# Patient Record
Sex: Female | Born: 1975 | Race: White | Hispanic: Yes | Marital: Married | Smoking: Never smoker
Health system: Southern US, Community
[De-identification: ages and names within clinical notes are randomized; demographics above are authoritative.]

## PROBLEM LIST (undated history)

## (undated) DIAGNOSIS — Z87898 Personal history of other specified conditions: Secondary | ICD-10-CM

## (undated) DIAGNOSIS — T7840XA Allergy, unspecified, initial encounter: Secondary | ICD-10-CM

## (undated) DIAGNOSIS — K219 Gastro-esophageal reflux disease without esophagitis: Secondary | ICD-10-CM

## (undated) HISTORY — DX: Personal history of other specified conditions: Z87.898

## (undated) HISTORY — DX: Allergy, unspecified, initial encounter: T78.40XA

## (undated) HISTORY — DX: Gastro-esophageal reflux disease without esophagitis: K21.9

## (undated) HISTORY — PX: NASAL SINUS SURGERY: SHX719

## (undated) HISTORY — PX: APPENDECTOMY: SHX54

---

## 1998-07-28 ENCOUNTER — Other Ambulatory Visit: Admission: RE | Admit: 1998-07-28 | Discharge: 1998-07-28 | Payer: Self-pay | Admitting: Gynecology

## 1998-09-09 ENCOUNTER — Encounter: Admission: RE | Admit: 1998-09-09 | Discharge: 1998-12-08 | Payer: Self-pay | Admitting: Gynecology

## 1998-11-03 ENCOUNTER — Inpatient Hospital Stay (HOSPITAL_COMMUNITY): Admission: AD | Admit: 1998-11-03 | Discharge: 1998-11-03 | Payer: Self-pay | Admitting: Gynecology

## 1998-11-27 ENCOUNTER — Inpatient Hospital Stay (HOSPITAL_COMMUNITY): Admission: AD | Admit: 1998-11-27 | Discharge: 1998-11-27 | Payer: Self-pay | Admitting: Gynecology

## 1999-01-13 ENCOUNTER — Encounter: Payer: Self-pay | Admitting: Gynecology

## 1999-01-13 ENCOUNTER — Ambulatory Visit (HOSPITAL_COMMUNITY): Admission: RE | Admit: 1999-01-13 | Discharge: 1999-01-13 | Payer: Self-pay | Admitting: Gynecology

## 1999-01-18 ENCOUNTER — Encounter: Admission: RE | Admit: 1999-01-18 | Discharge: 1999-04-18 | Payer: Self-pay | Admitting: Gynecology

## 1999-03-12 ENCOUNTER — Inpatient Hospital Stay (HOSPITAL_COMMUNITY): Admission: AD | Admit: 1999-03-12 | Discharge: 1999-03-12 | Payer: Self-pay | Admitting: Gynecology

## 1999-03-13 ENCOUNTER — Observation Stay (HOSPITAL_COMMUNITY): Admission: AD | Admit: 1999-03-13 | Discharge: 1999-03-14 | Payer: Self-pay | Admitting: Gynecology

## 1999-03-15 ENCOUNTER — Inpatient Hospital Stay (HOSPITAL_COMMUNITY): Admission: AD | Admit: 1999-03-15 | Discharge: 1999-03-19 | Payer: Self-pay | Admitting: Gynecology

## 1999-03-17 ENCOUNTER — Encounter (INDEPENDENT_AMBULATORY_CARE_PROVIDER_SITE_OTHER): Payer: Self-pay | Admitting: Specialist

## 1999-03-20 ENCOUNTER — Encounter (HOSPITAL_COMMUNITY): Admission: RE | Admit: 1999-03-20 | Discharge: 1999-06-18 | Payer: Self-pay | Admitting: Gynecology

## 2000-11-24 ENCOUNTER — Inpatient Hospital Stay (HOSPITAL_COMMUNITY): Admission: AD | Admit: 2000-11-24 | Discharge: 2000-11-24 | Payer: Self-pay | Admitting: Obstetrics & Gynecology

## 2001-01-05 ENCOUNTER — Ambulatory Visit (HOSPITAL_COMMUNITY): Admission: RE | Admit: 2001-01-05 | Discharge: 2001-01-05 | Payer: Self-pay | Admitting: *Deleted

## 2001-02-20 ENCOUNTER — Inpatient Hospital Stay (HOSPITAL_COMMUNITY): Admission: AD | Admit: 2001-02-20 | Discharge: 2001-02-20 | Payer: Self-pay | Admitting: *Deleted

## 2001-02-28 ENCOUNTER — Inpatient Hospital Stay (HOSPITAL_COMMUNITY): Admission: AD | Admit: 2001-02-28 | Discharge: 2001-03-03 | Payer: Self-pay | Admitting: *Deleted

## 2001-07-03 ENCOUNTER — Other Ambulatory Visit: Admission: RE | Admit: 2001-07-03 | Discharge: 2001-07-03 | Payer: Self-pay | Admitting: Obstetrics & Gynecology

## 2003-07-24 ENCOUNTER — Other Ambulatory Visit: Admission: RE | Admit: 2003-07-24 | Discharge: 2003-07-24 | Payer: Self-pay | Admitting: Gynecology

## 2004-05-31 ENCOUNTER — Encounter: Admission: RE | Admit: 2004-05-31 | Discharge: 2004-05-31 | Payer: Self-pay | Admitting: Otolaryngology

## 2004-08-18 ENCOUNTER — Other Ambulatory Visit: Admission: RE | Admit: 2004-08-18 | Discharge: 2004-08-18 | Payer: Self-pay | Admitting: Gynecology

## 2005-10-13 ENCOUNTER — Other Ambulatory Visit: Admission: RE | Admit: 2005-10-13 | Discharge: 2005-10-13 | Payer: Self-pay | Admitting: Gynecology

## 2006-10-25 ENCOUNTER — Other Ambulatory Visit: Admission: RE | Admit: 2006-10-25 | Discharge: 2006-10-25 | Payer: Self-pay | Admitting: Gynecology

## 2006-11-18 DIAGNOSIS — Z9889 Other specified postprocedural states: Secondary | ICD-10-CM

## 2006-12-06 ENCOUNTER — Ambulatory Visit (HOSPITAL_BASED_OUTPATIENT_CLINIC_OR_DEPARTMENT_OTHER): Admission: RE | Admit: 2006-12-06 | Discharge: 2006-12-06 | Payer: Self-pay | Admitting: Gynecology

## 2007-02-14 ENCOUNTER — Encounter: Payer: Self-pay | Admitting: Internal Medicine

## 2007-02-14 ENCOUNTER — Ambulatory Visit: Payer: Self-pay | Admitting: Internal Medicine

## 2007-02-14 DIAGNOSIS — IMO0002 Reserved for concepts with insufficient information to code with codable children: Secondary | ICD-10-CM | POA: Insufficient documentation

## 2007-04-16 ENCOUNTER — Ambulatory Visit: Payer: Self-pay | Admitting: Family Medicine

## 2007-04-16 DIAGNOSIS — R42 Dizziness and giddiness: Secondary | ICD-10-CM

## 2007-04-16 LAB — CONVERTED CEMR LAB: Beta hcg, urine, semiquantitative: NEGATIVE

## 2007-04-20 ENCOUNTER — Encounter (INDEPENDENT_AMBULATORY_CARE_PROVIDER_SITE_OTHER): Payer: Self-pay | Admitting: *Deleted

## 2007-06-04 ENCOUNTER — Ambulatory Visit: Payer: Self-pay | Admitting: Family Medicine

## 2007-06-04 DIAGNOSIS — L259 Unspecified contact dermatitis, unspecified cause: Secondary | ICD-10-CM

## 2007-06-05 ENCOUNTER — Telehealth (INDEPENDENT_AMBULATORY_CARE_PROVIDER_SITE_OTHER): Payer: Self-pay | Admitting: Family Medicine

## 2007-06-05 LAB — CONVERTED CEMR LAB
Basophils Relative: 0.2 % (ref 0.0–1.0)
Eosinophils Absolute: 0 10*3/uL (ref 0.0–0.6)
Eosinophils Relative: 0.4 % (ref 0.0–5.0)
HCT: 33.4 % — ABNORMAL LOW (ref 36.0–46.0)
LDL Cholesterol: 106 mg/dL — ABNORMAL HIGH (ref 0–99)
Neutrophils Relative %: 59.4 % (ref 43.0–77.0)
Platelets: 212 10*3/uL (ref 150–400)
RBC: 3.8 M/uL — ABNORMAL LOW (ref 3.87–5.11)
RDW: 13.9 % (ref 11.5–14.6)
TSH: 1.53 microintl units/mL (ref 0.35–5.50)
Total CHOL/HDL Ratio: 3.3
Triglycerides: 40 mg/dL (ref 0–149)
VLDL: 8 mg/dL (ref 0–40)
WBC: 4.4 10*3/uL — ABNORMAL LOW (ref 4.5–10.5)

## 2007-06-12 ENCOUNTER — Ambulatory Visit: Payer: Self-pay | Admitting: Family Medicine

## 2007-06-12 LAB — CONVERTED CEMR LAB
Ferritin: 5.7 ng/mL — ABNORMAL LOW (ref 10.0–291.0)
Iron: 63 ug/dL (ref 42–145)
Transferrin: 291.8 mg/dL (ref >=212.0)

## 2007-06-13 ENCOUNTER — Encounter (INDEPENDENT_AMBULATORY_CARE_PROVIDER_SITE_OTHER): Payer: Self-pay | Admitting: Family Medicine

## 2007-06-13 DIAGNOSIS — D649 Anemia, unspecified: Secondary | ICD-10-CM | POA: Insufficient documentation

## 2007-06-14 ENCOUNTER — Telehealth (INDEPENDENT_AMBULATORY_CARE_PROVIDER_SITE_OTHER): Payer: Self-pay | Admitting: *Deleted

## 2008-01-25 ENCOUNTER — Ambulatory Visit (HOSPITAL_COMMUNITY): Admission: RE | Admit: 2008-01-25 | Discharge: 2008-01-25 | Payer: Self-pay | Admitting: Obstetrics and Gynecology

## 2008-06-26 ENCOUNTER — Telehealth (INDEPENDENT_AMBULATORY_CARE_PROVIDER_SITE_OTHER): Payer: Self-pay | Admitting: *Deleted

## 2009-04-13 LAB — CONVERTED CEMR LAB: Pap Smear: NORMAL

## 2009-07-23 ENCOUNTER — Emergency Department (HOSPITAL_COMMUNITY): Admission: EM | Admit: 2009-07-23 | Discharge: 2009-07-23 | Payer: Self-pay | Admitting: Emergency Medicine

## 2009-09-23 ENCOUNTER — Inpatient Hospital Stay: Admission: AD | Admit: 2009-09-23 | Discharge: 2009-09-24 | Payer: Self-pay | Admitting: Obstetrics and Gynecology

## 2009-09-24 ENCOUNTER — Inpatient Hospital Stay (HOSPITAL_COMMUNITY): Admission: AD | Admit: 2009-09-24 | Discharge: 2009-09-27 | Payer: Self-pay | Admitting: Obstetrics and Gynecology

## 2009-12-16 ENCOUNTER — Ambulatory Visit: Payer: Self-pay | Admitting: Family

## 2009-12-16 ENCOUNTER — Telehealth: Payer: Self-pay | Admitting: Family

## 2009-12-16 DIAGNOSIS — H60399 Other infective otitis externa, unspecified ear: Secondary | ICD-10-CM | POA: Insufficient documentation

## 2009-12-16 DIAGNOSIS — G5600 Carpal tunnel syndrome, unspecified upper limb: Secondary | ICD-10-CM | POA: Insufficient documentation

## 2010-02-01 ENCOUNTER — Ambulatory Visit: Payer: Self-pay | Admitting: Internal Medicine

## 2010-02-01 DIAGNOSIS — N912 Amenorrhea, unspecified: Secondary | ICD-10-CM | POA: Insufficient documentation

## 2010-02-01 DIAGNOSIS — R1084 Generalized abdominal pain: Secondary | ICD-10-CM | POA: Insufficient documentation

## 2010-02-01 DIAGNOSIS — R197 Diarrhea, unspecified: Secondary | ICD-10-CM

## 2010-02-01 LAB — CONVERTED CEMR LAB
AST: 17 units/L (ref 0–37)
BUN: 12 mg/dL (ref 6–23)
Basophils Absolute: 0 10*3/uL (ref 0.0–0.1)
Bilirubin, Direct: 0.1 mg/dL (ref 0.0–0.3)
Creatinine, Ser: 0.62 mg/dL (ref 0.40–1.20)
Eosinophils Relative: 1 % (ref 0–5)
FSH: 5.6 milliintl units/mL
HCT: 39.2 % (ref 36.0–46.0)
Indirect Bilirubin: 0.3 mg/dL (ref 0.0–0.9)
LH: 5.4 milliintl units/mL
Lipase: 30 units/L (ref 0–75)
Lymphocytes Relative: 34 % (ref 12–46)
Neutrophils Relative %: 58 % (ref 43–77)
Platelets: 266 10*3/uL (ref 150–400)
Potassium: 4.6 meq/L (ref 3.5–5.3)
RDW: 15 % (ref 11.5–15.5)
TSH: 2.005 microintl units/mL (ref 0.350–4.500)
Total Bilirubin: 0.4 mg/dL (ref 0.3–1.2)

## 2010-02-02 ENCOUNTER — Telehealth: Payer: Self-pay | Admitting: Internal Medicine

## 2010-02-08 ENCOUNTER — Ambulatory Visit: Payer: Self-pay | Admitting: Internal Medicine

## 2010-05-10 ENCOUNTER — Ambulatory Visit: Payer: Self-pay | Admitting: Internal Medicine

## 2010-05-10 DIAGNOSIS — H6980 Other specified disorders of Eustachian tube, unspecified ear: Secondary | ICD-10-CM | POA: Insufficient documentation

## 2010-05-10 DIAGNOSIS — K589 Irritable bowel syndrome without diarrhea: Secondary | ICD-10-CM | POA: Insufficient documentation

## 2010-05-10 LAB — CONVERTED CEMR LAB
Blood in Urine, dipstick: NEGATIVE
Nitrite: NEGATIVE
Protein, U semiquant: NEGATIVE
WBC Urine, dipstick: NEGATIVE

## 2010-10-19 NOTE — Assessment & Plan Note (Signed)
Summary: cpx/mhf   Vital Signs:  Patient profile:   35 year old female Height:      62.25 inches Weight:      145.50 pounds BMI:     26.49 O2 Sat:      99 % on Room air Temp:     98.0 degrees F oral Pulse rate:   69 / minute Pulse rhythm:   regular Resp:     14 per minute BP sitting:   100 / 70  (left arm) Cuff size:   regular  Vitals Entered By: Glendell Docker CMA (May 10, 2010 11:27 AM)  O2 Flow:  Room air CC: CPX Is Patient Diabetic? No Pain Assessment Patient in pain? no       Does patient need assistance? Functional Status Self care Ambulation Normal Comments no concerns, not breast feeding   Primary Care Provider:  Dondra Spry DO  CC:  CPX.  History of Present Illness: 35 y/o Hispanic female for routine cpx overall doing well  1 week lower abd cramping her periods are regular - 28 - 30 days no blood in stools no change in bowel habits her husband had vasectomy  when she has symptoms - last 1 day no improvement with midrol not related to menstrual cycle  still having intermittent vertigo both ears -  pain and echo,  occ click in the ear  Preventive Screening-Counseling & Management  Alcohol-Tobacco     Alcohol drinks/day: <1     Smoking Status: never  Caffeine-Diet-Exercise     Caffeine use/day: 1-2 beverages daily     Does Patient Exercise: no  Allergies (verified): No Known Drug Allergies  Past History:  Past Medical History: History of intermittent vertigo G2 P2      Past Surgical History: Caesarean section X 2 Sinus surgery Appendectomy      Family History: CAD - M. Aunt DM - MGF Breast cancer  Social History: Married Three children. Speaks Spanish  Never Smoked  Alcohol use-yes Regular exercise-no Caffeine use/day:  1-2 beverages daily  Review of Systems       The patient complains of abdominal pain.  The patient denies fever, weight loss, chest pain, dyspnea on exertion, prolonged cough, melena,  hematochezia, severe indigestion/heartburn, and depression.         had a bad migraine several weeks ago last migraine was 6 yrs ago  Physical Exam  General:  alert, well-developed, and well-nourished.   Head:  normocephalic and atraumatic.   Eyes:  pupils equal, pupils round, and pupils reactive to light.   Ears:  Rt and Lt TM slightly retracted 1-2 mm papilloma right ext aud canal Mouth:  good dentition and pharynx pink and moist.   Neck:  supple, no masses, no thyromegaly, and no carotid bruits.   Lungs:  normal respiratory effort, normal breath sounds, no crackles, and no wheezes.   Heart:  normal rate, regular rhythm, no murmur, and no gallop.   Abdomen:  soft.  mild LUQ and suprapubic tenderness Extremities:  No lower extremity edema  Neurologic:  cranial nerves II-XII intact and gait normal.     Impression & Recommendations:  Problem # 1:  WELL ADULT EXAM (ICD-V70.0) Reviewed adult health maintenance protocols. routine PAP / Pelvic with GYN we discussed obtaining lipid panel next year Pap smear: normal (04/13/2009) Td Booster: Td (09/19/2001)   Chol: 164 (06/04/2007)   HDL: 50.1 (06/04/2007)   LDL: 106 (06/04/2007)   TG: 40 (06/04/2007) TSH: 2.005 (02/01/2010)  Problem # 2:  IRRITABLE BOWEL SYNDROME (ICD-564.1) she has intermittent abd bloating / cramping sensation c/w IBS. she reports  BMs q 2 days - usually somewhat hard to pass I rec daily fiber supplement and probiotic use Patient advised to call office if symptoms persist or worsen.  Problem # 3:  EUSTACHIAN TUBE DYSFUNCTION (ICD-381.81) intermittent clicking sensation use allegra and intranasal steroid spray  Complete Medication List: 1)  Omeprazole 40 Mg Cpdr (Omeprazole) .... One by mouth once daily 30 mins before am meal 2)  Fexofenadine Hcl 180 Mg Tabs (Fexofenadine hcl) .... One by mouth once daily 3)  Fluticasone Propionate 50 Mcg/act Susp (Fluticasone propionate) .... 2 sprays each nostril once  daily  Other Orders: UA Dipstick w/o Micro (manual) (10272) Urine Pregnancy Test  (53664)  Patient Instructions: 1)  Take fiber supplement (metamucile once daily) 2)  Use probiotic supplement daily (Align, Accu Flora) 3)  Please schedule a follow-up appointment in 2 months. Prescriptions: FLUTICASONE PROPIONATE 50 MCG/ACT SUSP (FLUTICASONE PROPIONATE) 2 sprays each nostril once daily  #1 x 2   Entered and Authorized by:   D. Thomos Lemons DO   Signed by:   D. Thomos Lemons DO on 05/10/2010   Method used:   Electronically to        [facility] [address] (retail)       [address]       [place], [place]  [address]       Ph: [phone] or [phone]       Fax: [phone]   RxID:   [id number] FEXOFENADINE HCL 180 MG TABS (FEXOFENADINE HCL) one by mouth once daily  #90 x 1   Entered and Authorized by:   D. Thomos Lemons DO   Signed by:   D. Thomos Lemons DO on 05/10/2010   Method used:   Electronically to        [facility] [address] (retail)       [address]       [place], [place]  [address]       Ph: [phone] or [phone]       Fax: [phone]   RxID:   [id number]   Current Allergies (reviewed today): No known allergies     Laboratory Results   Urine Tests    Routine Urinalysis   Color: yellow Appearance: Clear Glucose: negative   (Normal Range: Negative) Bilirubin: negative   (Normal Range: Negative) Ketone: negative   (Normal Range: Negative) Spec. Gravity: 1.010   (Normal Range: 1.003-1.035) Blood: negative   (Normal Range: Negative) pH: 7.0   (Normal Range: 5.0-8.0) Protein: negative   (Normal Range: Negative) Urobilinogen: 0.2   (Normal Range: 0-1) Nitrite: negative   (Normal Range: Negative) Leukocyte Esterace: negative   (Normal Range: Negative)    Urine HCG: negative

## 2010-10-19 NOTE — Progress Notes (Signed)
Summary: lab results  Phone Note Outgoing Call   Summary of Call: call pt - blood work is normal.  I will discuss in detail at next OV Initial call taken by: D. Thomos Lemons DO,  Feb 02, 2010 9:43 AM  Follow-up for Phone Call        Pt advised per Dr. Olegario Messier instructions.  Mervin Kung CMA  Feb 02, 2010 9:53 AM

## 2010-10-19 NOTE — Assessment & Plan Note (Signed)
Summary: SICK ON STOMACH & HEADACHE/DT   Vital Signs:  Patient profile:   35 year old female Weight:      144.25 pounds BMI:     26.27 O2 Sat:      98 % on Room air Temp:     98.2 degrees F oral Pulse rate:   72 / minute Pulse rhythm:   regular Resp:     20 per minute BP sitting:   94 / 60  (right arm) Cuff size:   regular  Vitals Entered By: Glendell Docker CMA (Feb 01, 2010 1:28 PM)  O2 Flow:  Room air CC: Rm - 2 Stomach discomfort & Nausea   Primary Care Provider:  Dondra Spry DO  CC:  Rm - 2 Stomach discomfort & Nausea.  History of Present Illness: 35 y/o [place] female with multiple complaints.  dizziness, headache, abd pain and nausea prolonged standing causes dizziness also when she gets out bed too quickly  headaches (top of head)  severity 4 out of 5 takes ibuprofen for headache,  sometimes helps - not all the time take 400-600 mg sometimes 3 times per day  (3-4 x per week) tried zantac but no improvement  nausea and abd pain - 4/23 went to [place] to visit family sister and pt both got GI illness she was vomiting and experienced diarrhea.  vomiting better but still having abd pain and loose stools now after eating - she sharp left upper abd pain Loose BM's twice per  sister noted to have thyphoid   Allergies (verified): No Known Drug Allergies  Past History:  Past Medical History: History of vertigo G2 P2    Past Surgical History: Caesarean section X 2 Sinus surgery Appendectomy    Family History: CAD - M. Aunt DM - MGF     Social History: Married  two children. Speaks Spanish  Never Smoked Alcohol use-yes Regular exercise-no  Review of Systems       no menstruation since childbirth 4 months ago.  some blood loss during c section but she did not require transfusion   Physical Exam  General:  alert, well-developed, and well-nourished.   Head:  normocephalic and atraumatic.   Eyes:  pupils equal, pupils round, and pupils  reactive to light.  no nystagmus Ears:  R ear normal and L ear normal.   Mouth:  Oral mucosa and oropharynx without lesions or exudates.  Teeth in good repair. Neck:  No deformities, masses, or tenderness noted. Lungs:  normal respiratory effort, normal breath sounds, no crackles, and no wheezes.   Heart:  normal rate, regular rhythm, no murmur, and no gallop.   Abdomen:  epigastric and LUQ tenderness.soft, no guarding, no rigidity, no rebound tenderness, no hepatomegaly, and no splenomegaly.   Skin:  color normal, no rashes, and no suspicious lesions.   Psych:  normally interactive, good eye contact, not anxious appearing, and not depressed appearing.     Impression & Recommendations:  Problem # 1:  ABDOMINAL PAIN, GENERALIZED (ICD-789.07) 35 y/o [place] female with persistent abd pain after acute nausea , vomiting and diarrhea.    she traveled to [place] 4/23.  sister suspected to have typhoid.  check stool culture.    NSAIDs may be aggravating abd pain.  DC nsaids.  use PPI.  Orders: T-Basic Metabolic Panel [id number]) T-Hepatic Function [id number]) T-CBC w/Diff [id number]) T-Lipase [id number])  Problem # 2:  AMENORRHEA (ICD-626.0) Likely related to breast feeding.  urine pregnancy is normal.  check labs.  if prolactin level elevated.  consider MRI of brain consider headaches  Orders: T-TSH [id number]) T-T4, Free [id number]) T-LH [id number]) T-FSH [id number]) T- * Misc. Laboratory test [id number])  Problem # 3:  DIARRHEA, ACUTE (ICD-787.91) she has mild orthostasis.  BP laying 98/70, pulse 66.  BP standing 90/70,  pulse 89.  aggressive oral hydration.  I suspect dehydration contributing to headache. Orders: T-Culture, Stool ([id number]) T-Culture, Giardia / Cryptosporidium ([id number])  Complete Medication List: 1)  Omeprazole 40 Mg Cpdr (Omeprazole) .... One by mouth once daily 30 mins before am meal 2)  Ciprofloxacin Hcl 500 Mg Tabs (Ciprofloxacin  hcl) .... One by mouth bid  Patient Instructions: 1)  Stop taking ibuprofen 2)  Use tylenol 650 mg two times a day as needed for headache 3)  Please schedule a follow-up appointment in 1 week. 4)  Increase fluids  (8 to 10 glasses of fluids per day) Prescriptions: CIPROFLOXACIN HCL 500 MG TABS (CIPROFLOXACIN HCL) one by mouth bid  #14 x 0   Entered and Authorized by:   D. Thomos Lemons DO   Signed by:   D. Thomos Lemons DO on 02/01/2010   Method used:   Electronically to        [facility] [address] (retail)       [address]       [place], [place]  [address]       Ph: [phone] or [phone]       Fax: [phone]   RxID:   [id number] OMEPRAZOLE 40 MG CPDR (OMEPRAZOLE) one by mouth once daily 30 mins before AM meal  #30 x 3   Entered and Authorized by:   D. Thomos Lemons DO   Signed by:   D. Thomos Lemons DO on 02/01/2010   Method used:   Electronically to        [facility] [address] (retail)       [address]       [place], [place]  [address]       Ph: [phone] or [phone]       Fax: [phone]   RxID:   [id number]   Current Allergies (reviewed today): No known allergies     Preventive Care Screening  Pap Smear:    Date:  04/13/2009    Results:  normal

## 2010-10-19 NOTE — Assessment & Plan Note (Signed)
Summary: 1 WEEK FU/DT   Vital Signs:  Patient profile:   35 year old female Temp:     97.9 degrees F oral Pulse rate:   77 / minute Pulse rhythm:   regular Resp:     16 per minute BP sitting:   111 / 67  (left arm) Cuff size:   regular  Vitals Entered By: Glendell Docker CMA (Feb 08, 2010 11:22 AM) CC: Rm 2 - 1 Week Follow up Comments no concerns, feels much better.   Primary Care Provider:  DThomos Lemons DO  CC:  Rm 2 - 1 Week Follow up.  History of Present Illness: 35 y/o [place] female recently seen for diarrhea, nausea, headache and dizziness for f/u her symptoms completely resolved after cipro and oral rehydration she is feeling back to herself stools are formed and normal headaches resolved  ammenorrhea - labs reviewed.  urine preg was negative   Allergies (verified): No Known Drug Allergies  Past History:  Past Medical History: History of vertigo G2 P2     Past Surgical History: Caesarean section X 2 Sinus surgery Appendectomy     Family History: CAD - M. Aunt DM - MGF      Social History: Married  two children. Speaks Spanish  Never Smoked Alcohol use-yes Regular exercise-no  Physical Exam  General:  alert, well-developed, and well-nourished.   Lungs:  normal respiratory effort and normal breath sounds.   Heart:  normal rate, regular rhythm, and no gallop.   Msk:   Extremities:  No lower extremity edema Psych:  normally interactive, good eye contact, not anxious appearing, and not depressed appearing.     Impression & Recommendations:  Problem # 1:  DIARRHEA, ACUTE (ICD-787.91) Assessment Improved GI illness resolved after completing cipro.  she never completed stool cultures.   I suspect infectious cause.  Problem # 2:  AMENORRHEA (ICD-626.0) Urine preg negative.   TSH, prolactin, FSH, LH - normal.  I suspect amenorrhea from breast feeding. she plans to breast feed for addt'l 2 months.  Complete Medication List: 1)  Omeprazole  40 Mg Cpdr (Omeprazole) .... One by mouth once daily 30 mins before am meal  Patient Instructions: 1)  Please schedule a follow-up appointment in 3 months for CPX 2)  Lipid Panel prior to visit, ICD-9: v70 3)  Please return for lab work one (1) week before your next appointment.   Current Allergies (reviewed today): No known allergies

## 2010-10-19 NOTE — Progress Notes (Signed)
Summary: cipro drops  Phone Note Call from Patient   Summary of Call: Pt. states the Cipro drops are too expensive.  She would like [place] to send in Neomycin/Polymixin B drops. Pls. advise. Call pt. back on cell# when completed.  Mervin Kung CMA  December 16, 2009 3:32 PM   Follow-up for Phone Call        OK to call in corticosporin otic 4 drops each ear three times a day x 7 days Follow-up by: Lemont Fillers FNP,  December 16, 2009 4:14 PM  Additional Follow-up for Phone Call Additional follow up Details #1::        Rx. called to Dorma Russell at [facility]. Pt. notified.  Nicki Guadalajara Fergerson CMA  December 16, 2009 4:53 PM     New/Updated Medications: * CORTICOSPORIN OTIC DROPS. Apply 4 drops each ear three times a day.  Current Allergies: No known allergies

## 2010-10-19 NOTE — Assessment & Plan Note (Signed)
Summary: ARM PAIN  RIGHT  /HEA   Vital Signs:  Patient profile:   35 year old female Height:      62.25 inches Weight:      148.01 pounds BMI:     26.95 Temp:     97.9 degrees F oral Pulse rate:   79 / minute Pulse rhythm:   regular Resp:     16 per minute BP sitting:   108 / 70  (left arm) Cuff size:   regular  Vitals Entered By: Jeanette Jackson CMA (December 16, 2009 2:15 PM) CC: ROOM 4  Right wrist pain and swelling x 5 months. Pain radiates to hand and has some stiffness in fingers in the mornings.   CC:  ROOM 4  Right wrist pain and swelling x 5 months. Pain radiates to hand and has some stiffness in fingers in the mornings..  History of Present Illness: Jeanette Jackson is a 35 year old female who presents with c/o 5 month history of right wrist pain and hand numbness.  Notes + stiffness in her fingers.  Notes that she has occasional swelling on the dorsal aspect of the right wrist.  Pain is made worse by movement of her right thumb.  Patient notes that these symptoms started during her pregnancy. She has a 32 month old son.  She has tried ibuprofen with minimal improvement.  Notes that she has difficultly opening jars or door handles.  Notes pain improves as the day moves on. Patient tells me that she is currently breastfeeding. She has had some numbness in the fingers of her left hand right after delivery- this has now resolved.  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   Allergies (verified): No Known Drug Allergies  Physical Exam  General:  Well-developed,well-nourished,in no acute distress; alert,appropriate and cooperative throughout examination Head:  Normocephalic and atraumatic without obvious abnormalities. No apparent alopecia or balding. Ears:  +mild otitis exerna, dry skin with minimal erythema noted in bilateral ear canals.  R greater than L Msk:  Full ROM right wrist.  + phalan's, neg Tinels.  No swelling of wrist.   Impression &  Recommendations:  Problem # 1:  CARPAL TUNNEL SYNDROME (ICD-354.0) I recommended that the patient wear brace during the day as well as the night.  I recommended regular motrin x 1 week.  If no improvements with these measures, will consider referral to orthopedics.  Problem # 2:  OTITIS EXTERNA (ICD-380.10)  Her updated medication list for this problem includes:    Cipro Hc 0.2-1 % Susp (Ciprofloxacin-hydrocortisone) .Marland KitchenMarland KitchenMarland KitchenMarland Kitchen 3 drops in each ear twice daily for 7 days  Complete Medication List: 1)  Ibuprofen 200 Mg Tabs (Ibuprofen) .Marland Kitchen.. 1 to 3 tablets every day 2)  Cipro Hc 0.2-1 % Susp (Ciprofloxacin-hydrocortisone) .... 3 drops in each ear twice daily for 7 days  Patient Instructions: 1)  Use ibuprofen every 6 hours for the next 1 week, then as needed 2)  Wear your wrist brace during the day and night. 3)  Call if your symptoms worsen or do not improve with these measures. Prescriptions: CIPRO HC 0.2-1 % SUSP (CIPROFLOXACIN-HYDROCORTISONE) 3 drops in each ear twice daily for 7 days  #1 x 0   Entered and Authorized by:   Lemont Fillers FNP   Signed by:   Lemont Fillers FNP on 12/16/2009   Method used:   Electronically to        [facility] [phone]* (retail)  [address]       [place], [place]  [address]       Ph: [phone] or [phone]       Fax: [phone]   RxID:   [id number]   Current Allergies (reviewed today): No known allergies

## 2010-12-04 LAB — CBC
HCT: 26.4 % — ABNORMAL LOW (ref 36.0–46.0)
HCT: 32.8 % — ABNORMAL LOW (ref 36.0–46.0)
MCHC: 33.1 g/dL (ref 30.0–36.0)
MCV: 84.3 fL (ref 78.0–100.0)
MCV: 85.1 fL (ref 78.0–100.0)
Platelets: 163 10*3/uL (ref 150–400)
Platelets: 195 10*3/uL (ref 150–400)
RDW: 23.2 % — ABNORMAL HIGH (ref 11.5–15.5)
RDW: 23.3 % — ABNORMAL HIGH (ref 11.5–15.5)

## 2010-12-22 LAB — URINALYSIS, ROUTINE W REFLEX MICROSCOPIC
Bilirubin Urine: NEGATIVE
Glucose, UA: NEGATIVE mg/dL
Hgb urine dipstick: NEGATIVE
Ketones, ur: 15 mg/dL — AB
Nitrite: NEGATIVE
Protein, ur: NEGATIVE mg/dL
Specific Gravity, Urine: 1.01 (ref 1.005–1.030)
Urobilinogen, UA: 0.2 mg/dL (ref 0.0–1.0)
pH: 8.5 — ABNORMAL HIGH (ref 5.0–8.0)

## 2010-12-22 LAB — URINE MICROSCOPIC-ADD ON

## 2010-12-22 LAB — URINE CULTURE: Colony Count: 65000

## 2011-01-26 ENCOUNTER — Encounter: Payer: Self-pay | Admitting: Internal Medicine

## 2011-01-26 ENCOUNTER — Ambulatory Visit (INDEPENDENT_AMBULATORY_CARE_PROVIDER_SITE_OTHER): Payer: BC Managed Care – PPO | Admitting: Internal Medicine

## 2011-01-26 VITALS — BP 90/60 | HR 66 | Temp 98.0°F | Resp 18 | Ht 62.25 in | Wt 146.0 lb

## 2011-01-26 DIAGNOSIS — R142 Eructation: Secondary | ICD-10-CM

## 2011-01-26 DIAGNOSIS — R141 Gas pain: Secondary | ICD-10-CM

## 2011-01-26 DIAGNOSIS — R140 Abdominal distension (gaseous): Secondary | ICD-10-CM

## 2011-01-26 DIAGNOSIS — R112 Nausea with vomiting, unspecified: Secondary | ICD-10-CM

## 2011-01-26 DIAGNOSIS — R143 Flatulence: Secondary | ICD-10-CM

## 2011-01-26 MED ORDER — OMEPRAZOLE 40 MG PO CPDR: 40.0000 mg | DELAYED_RELEASE_CAPSULE | Freq: Every day | ORAL | Status: DC

## 2011-01-26 NOTE — Progress Notes (Signed)
  Subjective:    Patient ID: HARPREET SIGNORE, female    DOB: [birth year], 35 y.o.   MRN: [id number]  HPI    Review of Systems  Past Medical History  Diagnosis Date  . History of vertigo     history of intermittent vertigo    History   Social History  . Marital Status: Married    Spouse Name: N/A    Number of Children: N/A  . Years of Education: N/A   Occupational History  . Not on file.   Social History Main Topics  . Smoking status: Never Smoker   . Smokeless tobacco: Not on file  . Alcohol Use: Not on file  . Drug Use: Not on file  . Sexually Active: Not on file   Other Topics Concern  . Not on file   Social History Narrative   MarriedThree children.Speaks Spanish Never Smoked Alcohol use-yesRegular exercise-noCaffeine use/day:  1-2 beverages daily    Past Surgical History  Procedure Date  . Cesarean section     x 2  . Nasal sinus surgery   . Appendectomy     Family History  Problem Relation Age of Onset  . Coronary artery disease Maternal Aunt   . Diabetes Maternal Grandfather   . Breast cancer      No Known Allergies  No current outpatient prescriptions on file prior to visit.    BP 90/60  Pulse 66  Temp(Src) 98 F (36.7 C) (Oral)  Resp 18  Ht 5' 2.25" (1.581 m)  Wt 146 lb (66.225 kg)  BMI 26.49 kg/m2  SpO2 99%  LMP 11/01/2010       Objective:   Physical Exam        Assessment & Plan:

## 2011-01-26 NOTE — Patient Instructions (Addendum)
Our office will contact you re:  GI referral Please see attached anti reflux handout. Please call our office if your back symptoms gets worse. Perform back exercises as directed at home x 2-4 weeks

## 2011-02-04 NOTE — Discharge Summary (Signed)
[facility]  Patient:    Jeanette Jackson, Jeanette Jackson                     MRN: [id number] Adm. Date:  [id number] Disc. Date: 03/03/01 Attending:  Michaelle Copas Dictator:   Gwenlyn Perking, M.D.                           Discharge Summary  ADMISSION DIAGNOSIS:          A 35 year old G2, P1-0-0-1 at 39 weeks and 6 days in labor.  DISCHARGE DIAGNOSIS:          A 35 year old G2. P2-0-0-2 delivered a healthy, viable female infant by low transverse cesarean section on February 28, 2001.  REFERRING FACILITY:           [facility].  CONSULTATIONS:                None.  PROCEDURES:                   Low transverse cesarean section on February 28 2001 of viable female inferior anterior with Apgars of 9/1 and 9/5. HISTORY AND PHYSICAL EXAMINATION:         See admission H&P.  HOSPITAL COURSE:              The patient is a 35 year old who presented to the maternity admission unit at [facility] on February 28, 2001 in labor. She was 39 weeks and 6 days pregnant.  The patient had a prior C-section in [place] and desired a trial of labor with VBAC.  However, the patient was found to arrest in her descent and, therefore, was taken to the operating room and a lower transverse cesarean section was performed by Dr. Gavin Potters.  The product of this gestation was a viable female with Apgars of 9/1 and 9/5.  The patient tolerated this procedure well.  In the ensuing days in the hospital during the postoperative period, the patient recovered nicely.  Her postoperative hemoglobin was 10.0 on March 01, 2001.  On March 03, 2001 (postoperative day #3), it was decided that the patient had benefited maximally fro this hospital admission and could be discharged to home safely.  She desires intrauterine device placement for contraception at her six-week follow up with [facility].  The patient is breast feeding.  CONDITION ON DISCHARGE:       Stable.  DISPOSITION:                   Discharged to home.  MEDICATIONS AT DISCHARGE:     Ibuprofen 600 mg p.o. q.6h. p.r.n. pain or cramping.  Prenatal vitamins, one p.o. q.d. x 2 months.  Tylox, one p.o. q.4h. p.r.n. severe pain.  INSTRUCTIONS:                 Activity per instruction booklet.  Regular diet. Wound care per instruction booklet.  Symptoms to warrant further treatment should the patient develop high fever, rigors, chills or severe abdominal pain, she should come back to the maternity admission unit at [facility] for a reevaluation and treatment.  FOLLOW UP:                    The patient is to follow up at [facility] for her postpartum check in six weeks. DD:  03/03/01 TD:  03/03/01 Job: [id number]

## 2011-02-04 NOTE — Op Note (Signed)
[name], Jeanette Jackson              ACCOUNT NO.:  [id number]   MEDICAL RECORD NO.:  [id number]          PATIENT TYPE:  AMB   LOCATION:  [facility]                         FACILITY:  [facility]   PHYSICIAN:  Gretta Cool, M.D. DATE OF BIRTH:  1976-09-09   DATE OF PROCEDURE:  12/06/2006  DATE OF DISCHARGE:                               OPERATIVE REPORT   PREOPERATIVE DIAGNOSIS:  Incapacitating cyclic pelvic pain, onset  following a ruptured appendix treated in [place].  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   POSTOPERATIVE DIAGNOSIS:  Stage II endometriosis.  No evidence of  significant adhesions.  Post appendectomy.   OPERATION/PROCEDURE:  1. Diagnostic laparoscopy.  2. Laser oblation all visible endometriosis CO2 laser.   SURGEON:  Gretta Cool, M.D.   ANESTHESIA:  General orotracheal.   DESCRIPTION OF PROCEDURE:  Under excellent general anesthesia with the  patient prepped and draped in Allen stirrups in the supine position, the  Veress cannula  was introduced after adequate pneumoperitoneum.  Laparoscopic trocar was introduced and pelvic organs visualized.  There  was  immediately visible evidence of endometriosis involving the  anterior bladder peritoneum, involving the posterior aspect of the  uterus, uterosacral ligaments bilaterally, and the right and left  posterior broad ligaments.  Photographs were taken of the endometriosis.  There was significant deep burrowing-type endometriosis particularly at  the uterosacral ligaments.  There was no significant involvement of  either ovary.  Both ovaries appeared to have been regularly ovulating  until recent therapy with hormonal birth control.  An examination of the  remainder of her abdomen revealed no other evidence of endometriosis and  no evidence of significant adhesion from her appendectomy and no  evidence of significant previous pelvic inflammatory process about the  appendix.  At this point the accessory ports were replaced.   The Najat  suction irrigator was placed in one of the port sites and the laser  connected to the operating laparoscope.  The lesions were then  systematically vaporized so as to eliminate all evidence of  endometriosis.  The lesions were treated deeply through all of the  endometriosis implants.  Surface endometriosis was treated by a defocus  laser brush technique.  At this point, once all of the disease was  vaporized and the lesions irrigated to remove carbon and debris and to  assure that there was no persistence of deeper disease,  procedure was  terminated without complication and the gas allowed to escape.  Approximately 500 mL of irrigation fluid left in the pelvis.  The  abdominal ports were then closed with deep suture of 0 Vicryl and then  skin closure of 5-0 Vicryl and Steri-Strips.  At the end of the  procedure sponge and lap counts were correct.  No complications.  The  patient returned recovery room in excellent condition.          ______________________________  Gretta Cool, M.D.    CWL/MEDQ  D:  12/06/2006  T:  12/07/2006  Job:  [id number]

## 2011-02-04 NOTE — Op Note (Signed)
[facility]  Patient:    Jeanette Jackson, Jeanette Jackson                       MRN: [id number] Proc. Date: 02/28/01 Attending:  Conni Elliot, M.D. Dictator:   Jamey Reas, M.D.                           Operative Report  DATE OF BIRTH:                Jan 08, 1976.  PREOPERATIVE DIAGNOSES:       1. Term intrauterine pregnancy                               2. Arrest of descent.                               3. Previous low transverse cesarean section.  POSTOPERATIVE DIAGNOSES:      1. Term intrauterine pregnancy                               2. Arrest of descent.                               3. Previous low transverse cesarean section.  PROCEDURE:                    Repeat low transverse cesarean section via                               Pfannenstiel.  SURGEON:                      Conni Elliot, M.D.  ASSISTANT:                    Jamey Reas, M.D.  ANESTHESIA:                   Epidural.  COMPLICATIONS:                None.  ESTIMATED BLOOD LOSS:         600 cc.  INTRAVENOUS FLUIDS:           2600 cc lactated Ringers.  URINE OUTPUT:                 200 cc, clear at the end of procedure.  INDICATIONS:                  A 35 year old, G2, P1-0-0-1, presented in active labor. Maximum dilatation 9 cm with no descent of the head.  FINDINGS:                     Female infant in cephalic presentation, clear fluid, at [phone]. Apgars 9 at one minute, 9 at five minutes. Cord pH 7.30.  DESCRIPTION OF PROCEDURE:     The patient was taken to the operating room where epidural anesthesia was found to be adequate. She was then prepped and draped in normal sterile fashion in the dorsal supine position with a leftward tilt. Pfannenstiel skin incision was then made  with the scalpel and carried through to the underlying layer of fascia with the knife. The fascia was incised in the midline and incision extended laterally with Mayo scissors. The superior  aspect of the fascial incision was then grasped with Kocher clamps, elevated and the underlying rectus muscles dissected off bluntly. Attention was then turned to the inferior aspect of this incision which, in a similar faShion, was grasped, tented up with Kocher clamps, and the rectus muscle dissected off bluntly. The rectus muscles were then separated in the midline, the peritoneum identified, tented up, and entered sharply with Metzenbaum scissors. The peritoneal incision was then extended superiorly and inferiorly with good visualization of the bladder. The bladder blade was then inserted and the vesicouterine peritoneum identified, grasped with pickups, and entered sharply with Metzenbaum scissors. This incision was then extended laterally, bladder flap created digitally. The bladder blade was then reinserted and the lower uterine segment incised in a transverse fashion with the scalpel. The uterine incision was then extended laterally with blunt dissection. The bladder blade was removed and the infants head delivered atraumatically. The nose and mouth were bulb suctioned and the cord clamped and cut. The infant was handed off to the awaiting pediatricians. Cord gases were sent. The placenta was then removed manually. The uterus was exteriorized and cleared of all clots and debris. The uterine incision was repaired with 1-0 chromic in a running lock fashion. The second layer, the same suture was used to obtain excellent hemostasis. The uterus was then returned to the abdomen, irrigated thoroughly, and cleared of all clots and debris. It was checked for any evidence of bleeding. The peritoneum was closed with a 2-0 chromic without complication. Fascia was reapproximated with 0 Vicryl in running fashion. A suture of 2-0 plain was use to place a running subcutaneous stitch. The skin was closed with staples. The patient tolerated the procedure well. Sponge, lap, and needle counts were  correct x 2. Cefotan 1 g was given at cord clamp. The patient was taken to the recovery room in stable condition. DD:  02/28/01 TD:  02/28/01 Job: [id number]

## 2011-03-02 ENCOUNTER — Ambulatory Visit: Payer: BC Managed Care – PPO | Admitting: Gastroenterology

## 2011-05-24 ENCOUNTER — Telehealth: Payer: Self-pay | Admitting: Internal Medicine

## 2011-05-24 NOTE — Telephone Encounter (Signed)
pts husband aware it was 2003

## 2011-05-24 NOTE — Telephone Encounter (Signed)
Patients husband brad would like to know when patient's last tetanus shot was.

## 2011-05-26 ENCOUNTER — Encounter: Payer: BC Managed Care – PPO | Admitting: Internal Medicine

## 2011-05-31 ENCOUNTER — Ambulatory Visit (INDEPENDENT_AMBULATORY_CARE_PROVIDER_SITE_OTHER): Payer: BC Managed Care – PPO | Admitting: Internal Medicine

## 2011-05-31 ENCOUNTER — Encounter: Payer: Self-pay | Admitting: Internal Medicine

## 2011-05-31 VITALS — BP 90/70 | HR 73 | Temp 97.8°F | Resp 18 | Ht 62.25 in | Wt 143.0 lb

## 2011-05-31 DIAGNOSIS — Z0000 Encounter for general adult medical examination without abnormal findings: Secondary | ICD-10-CM

## 2011-05-31 LAB — CBC
Hemoglobin: 12.5 g/dL (ref 12.0–15.0)
MCH: 29.7 pg (ref 26.0–34.0)
MCHC: 32.6 g/dL (ref 30.0–36.0)
Platelets: 241 10*3/uL (ref 150–400)

## 2011-05-31 LAB — LIPID PANEL
Cholesterol: 164 mg/dL (ref 0–200)
Total CHOL/HDL Ratio: 3.6 Ratio

## 2011-05-31 LAB — URINALYSIS, ROUTINE W REFLEX MICROSCOPIC
Bilirubin Urine: NEGATIVE
Glucose, UA: NEGATIVE mg/dL
Hgb urine dipstick: NEGATIVE
Ketones, ur: NEGATIVE mg/dL
Protein, ur: NEGATIVE mg/dL

## 2011-05-31 LAB — HEPATIC FUNCTION PANEL
AST: 14 U/L (ref 0–37)
Albumin: 4.5 g/dL (ref 3.5–5.2)
Alkaline Phosphatase: 57 U/L (ref 39–117)
Indirect Bilirubin: 0.3 mg/dL (ref 0.0–0.9)
Total Bilirubin: 0.4 mg/dL (ref 0.3–1.2)

## 2011-05-31 LAB — BASIC METABOLIC PANEL
Calcium: 9.3 mg/dL (ref 8.4–10.5)
Glucose, Bld: 90 mg/dL (ref 70–99)
Sodium: 140 mEq/L (ref 135–145)

## 2011-05-31 NOTE — Progress Notes (Signed)
  Subjective:    Patient ID: Jeanette Jackson, female    DOB: 07-Sep-1976, 35 y.o.   MRN: [id number]  HPI Pt presents to clinic for physical exam. No active complaints. Needs cpe for work including urine drug screen and proof of last tetanus (2003). Sees gyn for pap smears and reportedly utd.  Past Medical History  Diagnosis Date  . History of vertigo     history of intermittent vertigo   Past Surgical History  Procedure Date  . Cesarean section     x 2  . Nasal sinus surgery   . Appendectomy     reports that she has never smoked. She does not have any smokeless tobacco history on file. Her alcohol and drug histories not on file. family history includes Breast cancer in an unspecified family member; Coronary artery disease in her maternal aunt; and Diabetes in her maternal grandfather. No Known Allergies     Review of Systems see hpi     Objective:   Physical Exam  Physical Exam  Nursing note and vitals reviewed. Constitutional: Appears well-developed and well-nourished. No distress.  HENT: perrl, eom grossly intact. Op clear Head: Normocephalic and atraumatic.  Right Ear: External ear normal. Tm and canals nl Left Ear: External ear normal. Tm and canals nl Eyes: Conjunctivae are normal. No scleral icterus.  Neck: Neck supple. Carotid bruit is not present.  Cardiovascular: Normal rate, regular rhythm and normal heart sounds.  Exam reveals no gallop and no friction rub.   No murmur heard. Pulmonary/Chest: Effort normal and breath sounds normal. No respiratory distress. He has no wheezes. no rales.  Abd: soft, nd, nt, +bs. No masses or organomegaly. Lymphadenopathy:    He has no cervical adenopathy.  Neurological:Alert.  Skin: Skin is warm and dry. Not diaphoretic.  Psychiatric: Has a normal mood and affect.        Assessment & Plan:

## 2011-05-31 NOTE — Assessment & Plan Note (Signed)
Nl exam. Obtain cpe labs including urine drug screen required by work. Tetanus utd. Pap smears per gyn.

## 2011-06-01 LAB — DRUG SCREEN, URINE
Amphetamine Screen, Ur: NEGATIVE
Barbiturate Quant, Ur: NEGATIVE
Marijuana Metabolite: NEGATIVE
Methadone: NEGATIVE
Opiates: NEGATIVE

## 2011-08-12 ENCOUNTER — Ambulatory Visit (INDEPENDENT_AMBULATORY_CARE_PROVIDER_SITE_OTHER): Payer: BC Managed Care – PPO | Admitting: Internal Medicine

## 2011-08-12 ENCOUNTER — Encounter: Payer: Self-pay | Admitting: Internal Medicine

## 2011-08-12 VITALS — BP 90/60 | HR 78 | Temp 97.9°F | Resp 16 | Wt 144.0 lb

## 2011-08-12 DIAGNOSIS — H6090 Unspecified otitis externa, unspecified ear: Secondary | ICD-10-CM

## 2011-08-12 DIAGNOSIS — H60399 Other infective otitis externa, unspecified ear: Secondary | ICD-10-CM

## 2011-08-12 MED ORDER — AMOXICILLIN 875 MG PO TABS: 875.0000 mg | ORAL_TABLET | Freq: Two times a day (BID) | ORAL | Status: AC

## 2011-08-12 MED ORDER — NEOMYCIN-POLYMYXIN-HC 3.5-10000-1 OT SOLN: 3.0000 [drp] | Freq: Three times a day (TID) | OTIC | Status: AC

## 2011-08-12 NOTE — Progress Notes (Signed)
  Subjective:    Patient ID: Jeanette Jackson, female    DOB: 11/21/1975, 35 y.o.   MRN: [id number]  HPI Pt presents to clinic for evaluation of ear pain. Notes ~5 day h/o bilateral ear pain R>L with associated head congestion. Has noted intermittent discharge from bilateral ears. Denies f/c. No obvious decrease of auditory acuity. Attempted otc ear drops that she obtained from [place] without improvement. No other alleviating or exacerbating factors. No other complaints.  Past Medical History  Diagnosis Date  . History of vertigo     history of intermittent vertigo   Past Surgical History  Procedure Date  . Cesarean section     x 2  . Nasal sinus surgery   . Appendectomy     reports that she has never smoked. She has never used smokeless tobacco. Her alcohol and drug histories not on file. family history includes Breast cancer in an unspecified family member; Coronary artery disease in her maternal aunt; and Diabetes in her maternal grandfather. No Known Allergies     Review of Systems see hpi     Objective:   Physical Exam  Nursing note and vitals reviewed. Constitutional: She appears well-developed and well-nourished. No distress.  HENT:  Head: Normocephalic and atraumatic.  Right Ear: External ear normal.  Left Ear: External ear normal.  Nose: Nose normal.  Mouth/Throat: Oropharynx is clear and moist. No oropharyngeal exudate.       Bilateral ear canals with white/tan exudate. TM's intact. No FB or other abnormality.  Eyes: Conjunctivae are normal. No scleral icterus.  Neurological: She is alert.  Skin: Skin is warm and dry. She is not diaphoretic.  Psychiatric: She has a normal mood and affect.          Assessment & Plan:

## 2011-08-12 NOTE — Assessment & Plan Note (Signed)
Attempt combination of cortisporin otic gtts and po abx course. Followup if no improvement or worsening.

## 2012-02-09 ENCOUNTER — Telehealth: Payer: Self-pay | Admitting: Internal Medicine

## 2012-02-09 MED ORDER — OMEPRAZOLE 40 MG PO CPDR: 40.0000 mg | DELAYED_RELEASE_CAPSULE | Freq: Every day | ORAL | Status: DC

## 2012-02-09 NOTE — Telephone Encounter (Signed)
Rx refill sent to pharmacy. 

## 2012-02-09 NOTE — Telephone Encounter (Signed)
Refill-prilosec oral capsule delayed release 40mg . Take one capsule once a day. Qty 30 last fill 4.29.13

## 2012-04-23 ENCOUNTER — Ambulatory Visit (INDEPENDENT_AMBULATORY_CARE_PROVIDER_SITE_OTHER): Payer: BC Managed Care – PPO | Admitting: Internal Medicine

## 2012-04-23 ENCOUNTER — Encounter: Payer: Self-pay | Admitting: Internal Medicine

## 2012-04-23 VITALS — BP 102/62 | HR 74 | Temp 98.4°F | Resp 14 | Ht 62.25 in | Wt 146.0 lb

## 2012-04-23 DIAGNOSIS — N912 Amenorrhea, unspecified: Secondary | ICD-10-CM | POA: Insufficient documentation

## 2012-04-23 DIAGNOSIS — M549 Dorsalgia, unspecified: Secondary | ICD-10-CM

## 2012-04-23 MED ORDER — METHYLPREDNISOLONE 4 MG PO KIT: PACK | ORAL | Status: AC

## 2012-04-23 MED ORDER — HYDROCODONE-ACETAMINOPHEN 5-500 MG PO TABS: 1.0000 | ORAL_TABLET | Freq: Three times a day (TID) | ORAL | Status: AC | PRN

## 2012-04-23 MED ORDER — CYCLOBENZAPRINE HCL 5 MG PO TABS: 5.0000 mg | ORAL_TABLET | Freq: Three times a day (TID) | ORAL | Status: AC | PRN

## 2012-04-23 NOTE — Progress Notes (Signed)
  Subjective:    Patient ID: Jeanette Jackson, female    DOB: 08/07/1976, 36 y.o.   MRN: [id number]  HPI Pt presents to clinic for evaluation of back pain. Notes two week h/o right back pain with radiation to right buttock and right leg to foot. Has intermittent right leg paresthesia without leg weakness. No urinary sx's. Believes sx's may have occurred after lifting at work. Has attempted ibuprofen, aleve, tylenol and topical heat. No significant improvement noted. Notes some mild gi irritation with aleve. Does not have periods x one year but has recent spotting.  Past Medical History  Diagnosis Date  . History of vertigo     history of intermittent vertigo   Past Surgical History  Procedure Date  . Cesarean section     x 2  . Nasal sinus surgery   . Appendectomy     reports that she has never smoked. She has never used smokeless tobacco. Her alcohol and drug histories not on file. family history includes Breast cancer in an unspecified family member; Coronary artery disease in her maternal aunt; and Diabetes in her maternal grandfather. No Known Allergies   Review of Systems see hpi     Objective:   Physical Exam  Nursing note and vitals reviewed. Constitutional: She appears well-developed and well-nourished. No distress.  Musculoskeletal:       Right paraspinal muscle tenderness and spasm. No midline ls tenderness. +SLR on the right. Gait nl. Bilateral LE strength 5/5.   Neurological: She is alert.  Skin: Skin is warm and dry. She is not diaphoretic.  Psychiatric: She has a normal mood and affect.          Assessment & Plan:

## 2012-04-23 NOTE — Assessment & Plan Note (Signed)
Stop nsaids. Attempt medrol dosepak. vicodin and flexeril prn-cautioned re possible sedating effect. Schedule close follow up for re-evaluation. Obtain ua

## 2012-04-23 NOTE — Assessment & Plan Note (Signed)
Obtain urine hcg

## 2012-04-24 LAB — URINALYSIS, ROUTINE W REFLEX MICROSCOPIC
Bilirubin Urine: NEGATIVE
Glucose, UA: NEGATIVE mg/dL
Hgb urine dipstick: NEGATIVE
Ketones, ur: NEGATIVE mg/dL
Specific Gravity, Urine: 1.019 (ref 1.005–1.030)
pH: 5.5 (ref 5.0–8.0)

## 2012-05-10 ENCOUNTER — Ambulatory Visit: Payer: BC Managed Care – PPO | Admitting: Internal Medicine

## 2012-05-10 DIAGNOSIS — Z0289 Encounter for other administrative examinations: Secondary | ICD-10-CM

## 2012-06-06 ENCOUNTER — Encounter: Payer: BC Managed Care – PPO | Admitting: Family

## 2012-10-30 ENCOUNTER — Ambulatory Visit (INDEPENDENT_AMBULATORY_CARE_PROVIDER_SITE_OTHER): Payer: 59 | Admitting: Family

## 2012-10-30 ENCOUNTER — Encounter: Payer: Self-pay | Admitting: Family

## 2012-10-30 VITALS — BP 90/70 | HR 77 | Temp 98.8°F | Resp 16 | Ht 62.25 in | Wt 146.1 lb

## 2012-10-30 DIAGNOSIS — H6690 Otitis media, unspecified, unspecified ear: Secondary | ICD-10-CM

## 2012-10-30 MED ORDER — AMOXICILLIN-POT CLAVULANATE 875-125 MG PO TABS: 1.0000 | ORAL_TABLET | Freq: Two times a day (BID) | ORAL | Status: DC

## 2012-10-30 NOTE — Progress Notes (Signed)
  Subjective:    Patient ID: NATAKI MCCRUMB, female    DOB: [birth year], 37 y.o.   MRN: [id number]  HPI  Ms. Alvillar is a 37 yr old female who presents today with chief complaint of right ear pain. She reports that ear pain has been present for 2 weeks. + itching in her nose.  She has some sinus pressure.    Review of Systems See hpi      Objective:   Physical Exam  Constitutional: She is oriented to person, place, and time. She appears well-developed and well-nourished. No distress.  HENT:  Head: Normocephalic and atraumatic.  r TM is retracted and opaque. No erythema. l tm normal.mcerumen occluded r tm prior to removal with curette and flushing with water.  Eyes: Conjunctivae are normal.  Cardiovascular: Normal rate and regular rhythm.   No murmur heard. Pulmonary/Chest: Effort normal and breath sounds normal. No respiratory distress. She has no wheezes.  Musculoskeletal: She exhibits no edema.  Neurological: She is alert and oriented to person, place, and time.          Assessment & Plan:

## 2012-10-30 NOTE — Patient Instructions (Addendum)
Please call if symptoms worsen, or if not improved in 1 week.

## 2012-11-01 DIAGNOSIS — H6690 Otitis media, unspecified, unspecified ear: Secondary | ICD-10-CM | POA: Insufficient documentation

## 2012-11-01 NOTE — Assessment & Plan Note (Signed)
rx with augmentin.  

## 2014-12-19 LAB — HM PAP SMEAR: HM Pap smear: NORMAL

## 2015-01-24 LAB — HM PAP SMEAR: HM Pap smear: NORMAL

## 2015-12-21 ENCOUNTER — Encounter: Payer: Self-pay | Admitting: Behavioral Health

## 2015-12-21 ENCOUNTER — Telehealth: Payer: Self-pay | Admitting: Behavioral Health

## 2015-12-21 NOTE — Telephone Encounter (Signed)
Pre-Visit Call completed with patient and chart updated.   Pre-Visit Info documented in Specialty Comments under [employer].    

## 2015-12-22 ENCOUNTER — Encounter: Payer: Self-pay | Admitting: Medical

## 2015-12-22 ENCOUNTER — Ambulatory Visit (INDEPENDENT_AMBULATORY_CARE_PROVIDER_SITE_OTHER): Payer: Managed Care, Other (non HMO) | Admitting: Medical

## 2015-12-22 VITALS — BP 100/70 | HR 68 | Temp 98.1°F | Resp 16 | Ht 62.25 in | Wt 147.0 lb

## 2015-12-22 DIAGNOSIS — K219 Gastro-esophageal reflux disease without esophagitis: Secondary | ICD-10-CM | POA: Diagnosis not present

## 2015-12-22 DIAGNOSIS — R1013 Epigastric pain: Secondary | ICD-10-CM

## 2015-12-22 DIAGNOSIS — J309 Allergic rhinitis, unspecified: Secondary | ICD-10-CM | POA: Insufficient documentation

## 2015-12-22 LAB — COMPREHENSIVE METABOLIC PANEL
ALK PHOS: 54 U/L (ref 39–117)
ALT: 11 U/L (ref 0–35)
AST: 20 U/L (ref 0–37)
Albumin: 4.8 g/dL (ref 3.5–5.2)
BILIRUBIN TOTAL: 0.4 mg/dL (ref 0.2–1.2)
BUN: 8 mg/dL (ref 6–23)
CALCIUM: 9.9 mg/dL (ref 8.4–10.5)
CO2: 32 mEq/L (ref 19–32)
CREATININE: 0.56 mg/dL (ref 0.40–1.20)
Chloride: 101 mEq/L (ref 96–112)
GFR: 128.15 mL/min (ref >60.00)
GLUCOSE: 91 mg/dL (ref 70–99)
Potassium: 4.1 mEq/L (ref 3.5–5.1)
Sodium: 136 mEq/L (ref 135–145)
TOTAL PROTEIN: 7.6 g/dL (ref 6.0–8.3)

## 2015-12-22 LAB — CBC WITH DIFFERENTIAL/PLATELET
BASOS ABS: 0 10*3/uL (ref 0.0–0.1)
Basophils Relative: 0.6 % (ref 0.0–3.0)
Eosinophils Absolute: 0 10*3/uL (ref 0.0–0.7)
Eosinophils Relative: 0.3 % (ref 0.0–5.0)
HEMATOCRIT: 37.4 % (ref 36.0–46.0)
Hemoglobin: 12.4 g/dL (ref 12.0–15.0)
LYMPHS PCT: 32.5 % (ref 12.0–46.0)
Lymphs Abs: 2.1 10*3/uL (ref 0.7–4.0)
MCHC: 33.1 g/dL (ref 30.0–36.0)
MCV: 88.5 fl (ref 78.0–100.0)
MONOS PCT: 6.9 % (ref 3.0–12.0)
Monocytes Absolute: 0.5 10*3/uL (ref 0.1–1.0)
NEUTROS PCT: 59.7 % (ref 43.0–77.0)
Neutro Abs: 3.9 10*3/uL (ref 1.4–7.7)
Platelets: 243 10*3/uL (ref 150.0–400.0)
RBC: 4.23 Mil/uL (ref 3.87–5.11)
RDW: 13.9 % (ref 11.5–15.5)
WBC: 6.6 10*3/uL (ref 4.0–10.5)

## 2015-12-22 MED ORDER — OMEPRAZOLE 40 MG PO CPDR: 40.0000 mg | DELAYED_RELEASE_CAPSULE | Freq: Every day | ORAL | Status: DC

## 2015-12-22 NOTE — Progress Notes (Signed)
Pre visit review using our clinic review tool, if applicable. No additional management support is needed unless otherwise documented below in the visit note. 

## 2015-12-22 NOTE — Progress Notes (Signed)
Subjective:    Patient ID: Jeanette Jackson, female    DOB: 12/08/1975, 40 y.o.   MRN: [id number]  HPI  I have reviewed pt PMH, PSH, FH, Social History and Surgical History.   Pt works at [employer]. Exercise  just started, Fried foods, Some caffeine, Married with 3 children.  Pt states hx of gerd. Pt states 20 mg omeprazole a day. Pt state drinks 2 cups coffee  a day. Pt recently eating fried foods. This has flared recently. Pt takes medication sporadically. Over last 4 days had recent flare of gerd/notcontrolled. She has tried both otc zantac and low dose omeprazole.  Pt has some cousins with ehlers danlos syndrome but none in sisters or mother. Pt has no increased flexibility of joints.  Pt has some seasonal allergies. Last couple of years in spring. Pt takes allegra. Controlled presently.  LMP- last month.  Pt last pap 1 yr ago -smear was normal.  No family hx of breast cancer.     Review of Systems  Constitutional: Negative for fever, chills and fatigue.  HENT: Negative for congestion and drooling.   Respiratory: Negative for cough, chest tightness, shortness of breath and wheezing.   Cardiovascular: Negative for chest pain and palpitations.  Gastrointestinal: Positive for abdominal pain. Negative for nausea, vomiting, constipation and blood in stool.  Genitourinary: Negative for dysuria, frequency and flank pain.  Musculoskeletal: Negative for back pain.  Skin: Negative for pallor and rash.  Neurological: Negative for dizziness and headaches.  Hematological: Negative for adenopathy. Does not bruise/bleed easily.  Psychiatric/Behavioral: Negative for behavioral problems and confusion.     Past Medical History  Diagnosis Date  . History of vertigo     history of intermittent vertigo  . GERD (gastroesophageal reflux disease)   . Allergy     Social History   Social History  . Marital Status: Married    Spouse Name: N/A  . Number of Children: N/A  .  Years of Education: N/A   Occupational History  . Not on file.   Social History Main Topics  . Smoking status: Never Smoker   . Smokeless tobacco: Never Used  . Alcohol Use: Not on file  . Drug Use: Not on file  . Sexual Activity: Not on file   Other Topics Concern  . Not on file   Social History Narrative   Married   Three children.   Speaks Spanish    Never Smoked    Alcohol use-yes   Regular exercise-no   Caffeine use/day:  1-2 beverages daily    Past Surgical History  Procedure Laterality Date  . Cesarean section      x 2  . Nasal sinus surgery    . Appendectomy      Family History  Problem Relation Age of Onset  . Coronary artery disease Maternal Aunt   . Diabetes Maternal Grandfather     No Known Allergies  Current Outpatient Prescriptions on File Prior to Visit  Medication Sig Dispense Refill  . omeprazole (PRILOSEC) 40 MG capsule Take 1 capsule (40 mg total) by mouth daily. 30 capsule 3   No current facility-administered medications on file prior to visit.    BP 100/70 mmHg  Pulse 68  Temp(Src) 98.1 F (36.7 C) (Oral)  Resp 16  Ht 5' 2.25" (1.581 m)  Wt 147 lb (66.679 kg)  BMI 26.68 kg/m2  SpO2 98%       Objective:   Physical Exam  General  Mental Status - Alert. General Appearance - Well groomed. Not in acute distress.  Skin Rashes- No Rashes.  HEENT Head- Normal. Ear Auditory Canal - Left- Normal. Right - Normal.Tympanic Membrane- Left- Normal. Right- Normal. Eye Sclera/Conjunctiva- Left- Normal. Right- Normal. Nose & Sinuses Nasal Mucosa- Left-  Boggy and Congested. Right-  Boggy and  Congested.Bilateral  No maxillary and no frontal sinus pressure. Mouth & Throat Lips: Upper Lip- Normal: no dryness, cracking, pallor, cyanosis, or vesicular eruption. Lower Lip-Normal: no dryness, cracking, pallor, cyanosis or vesicular eruption. Buccal Mucosa- Bilateral- No Aphthous ulcers. Oropharynx- No Discharge or Erythema. Tonsils:  Characteristics- Bilateral- No Erythema or Congestion. Size/Enlargement- Bilateral- No enlargement. Discharge- bilateral-None.  Neck Neck- Supple. No Masses.   Chest and Lung Exam Auscultation: Breath Sounds:-Clear even and unlabored.  Cardiovascular Auscultation:Rythm- Regular, rate and rhythm. Murmurs & Other Heart Sounds:Ausculatation of the heart reveal- No Murmurs.  Lymphatic Head & Neck General Head & Neck Lymphatics: Bilateral: Description- No Localized lymphadenopathy.  Abdomen Inspection:-Inspection Normal.  Palpation/Perucssion: Palpation and Percussion of the abdomen reveal- faint epigastric Tender, No Rebound tenderness, No rigidity(Guarding) and No Palpable abdominal masses.  Liver:-Normal.  Spleen:- Normal.   Back- no cva tenderness         Assessment & Plan:  For you history for of reflux and recent epigastric pain will get cbc, cmp and h pylori breath test. Rx of omeprazole 40 mg a day.  Healthy diet will help as well.  For allergies continue allegra and flonase.   Schedule CPE fasting in a month or so at your convenience. When schedule come in fasting.

## 2015-12-22 NOTE — Patient Instructions (Signed)
For you history for of reflux and recent epigastric pain will get cbc, cmp and h pylori breath test. Rx of omeprazole 40 mg a day.  Healthy diet will help as well.  For allergies continue allegra and flonase.   Schedule CPE fasting in a month or so at your convenience. When schedule come in fasting.

## 2015-12-23 ENCOUNTER — Telehealth: Payer: Self-pay | Admitting: Medical

## 2015-12-23 LAB — H. PYLORI BREATH TEST: H. PYLORI BREATH TEST: DETECTED — AB

## 2015-12-23 MED ORDER — CLARITHROMYCIN 500 MG PO TABS: 500.0000 mg | ORAL_TABLET | Freq: Two times a day (BID) | ORAL | Status: DC

## 2015-12-23 MED ORDER — AMOXICILLIN 875 MG PO TABS: 875.0000 mg | ORAL_TABLET | Freq: Two times a day (BID) | ORAL | Status: DC

## 2015-12-23 NOTE — Telephone Encounter (Signed)
Advised pt that she should not take the antibiotic if she has not had her menstrual cycle. Pt states that her last cycle was 12/13/15. Pt was advised to call back to schedule an appointment in three weeks to follow up. Pt voices understanding.

## 2015-12-23 NOTE — Telephone Encounter (Signed)
Sent in amoxicillin and biaxin for h pylori. Make sure she knows she is not late for menses/pregnant before taking biaxin. biaxin not recommended if she is pregnant.

## 2016-01-19 ENCOUNTER — Encounter: Payer: Self-pay | Admitting: Medical

## 2016-01-19 ENCOUNTER — Ambulatory Visit (INDEPENDENT_AMBULATORY_CARE_PROVIDER_SITE_OTHER): Payer: Managed Care, Other (non HMO) | Admitting: Medical

## 2016-01-19 VITALS — BP 100/74 | HR 60 | Temp 98.0°F | Ht 62.25 in | Wt 145.8 lb

## 2016-01-19 DIAGNOSIS — M799 Soft tissue disorder, unspecified: Secondary | ICD-10-CM | POA: Diagnosis not present

## 2016-01-19 DIAGNOSIS — Z0001 Encounter for general adult medical examination with abnormal findings: Secondary | ICD-10-CM | POA: Diagnosis not present

## 2016-01-19 DIAGNOSIS — R6889 Other general symptoms and signs: Secondary | ICD-10-CM

## 2016-01-19 DIAGNOSIS — Z113 Encounter for screening for infections with a predominantly sexual mode of transmission: Secondary | ICD-10-CM | POA: Diagnosis not present

## 2016-01-19 DIAGNOSIS — M7989 Other specified soft tissue disorders: Secondary | ICD-10-CM

## 2016-01-19 DIAGNOSIS — Z23 Encounter for immunization: Secondary | ICD-10-CM

## 2016-01-19 LAB — COMPREHENSIVE METABOLIC PANEL
ALT: 9 U/L (ref 0–35)
AST: 14 U/L (ref 0–37)
Albumin: 4.7 g/dL (ref 3.5–5.2)
Alkaline Phosphatase: 50 U/L (ref 39–117)
BILIRUBIN TOTAL: 0.5 mg/dL (ref 0.2–1.2)
BUN: 11 mg/dL (ref 6–23)
CALCIUM: 9.7 mg/dL (ref 8.4–10.5)
CO2: 31 meq/L (ref 19–32)
Chloride: 102 mEq/L (ref 96–112)
Creatinine, Ser: 0.59 mg/dL (ref 0.40–1.20)
GFR: 120.61 mL/min (ref >60.00)
Glucose, Bld: 93 mg/dL (ref 70–99)
POTASSIUM: 4.1 meq/L (ref 3.5–5.1)
Sodium: 138 mEq/L (ref 135–145)
Total Protein: 7.4 g/dL (ref 6.0–8.3)

## 2016-01-19 LAB — CBC WITH DIFFERENTIAL/PLATELET
BASOS ABS: 0 10*3/uL (ref 0.0–0.1)
BASOS PCT: 0.5 % (ref 0.0–3.0)
EOS PCT: 0.2 % (ref 0.0–5.0)
Eosinophils Absolute: 0 10*3/uL (ref 0.0–0.7)
HEMATOCRIT: 38 % (ref 36.0–46.0)
Hemoglobin: 12.7 g/dL (ref 12.0–15.0)
LYMPHS ABS: 1.8 10*3/uL (ref 0.7–4.0)
LYMPHS PCT: 31.6 % (ref 12.0–46.0)
MCHC: 33.3 g/dL (ref 30.0–36.0)
MCV: 89.5 fl (ref 78.0–100.0)
MONOS PCT: 7.8 % (ref 3.0–12.0)
Monocytes Absolute: 0.5 10*3/uL (ref 0.1–1.0)
NEUTROS ABS: 3.5 10*3/uL (ref 1.4–7.7)
NEUTROS PCT: 59.9 % (ref 43.0–77.0)
PLATELETS: 224 10*3/uL (ref 150.0–400.0)
RBC: 4.25 Mil/uL (ref 3.87–5.11)
RDW: 14.2 % (ref 11.5–15.5)
WBC: 5.8 10*3/uL (ref 4.0–10.5)

## 2016-01-19 LAB — POC URINALSYSI DIPSTICK (AUTOMATED)
Bilirubin, UA: NEGATIVE
Glucose, UA: NEGATIVE
KETONES UA: NEGATIVE
Leukocytes, UA: NEGATIVE
NITRITE UA: NEGATIVE
PH UA: 5.5
PROTEIN UA: NEGATIVE
Spec Grav, UA: 1.015
UROBILINOGEN UA: 0.2

## 2016-01-19 LAB — LIPID PANEL
CHOLESTEROL: 192 mg/dL (ref 0–200)
HDL: 53.7 mg/dL (ref >39.00)
LDL CALC: 122 mg/dL — AB (ref 0–99)
NonHDL: 138.47
TRIGLYCERIDES: 80 mg/dL (ref 0.0–149.0)
Total CHOL/HDL Ratio: 4
VLDL: 16 mg/dL (ref 0.0–40.0)

## 2016-01-19 LAB — TSH: TSH: 2.48 u[IU]/mL (ref 0.35–4.50)

## 2016-01-19 NOTE — Progress Notes (Signed)
Pre visit review using our clinic review tool, if applicable. No additional management support is needed unless otherwise documented below in the visit note. 

## 2016-01-19 NOTE — Progress Notes (Signed)
Subjective:    Patient ID: Jeanette Jackson, female    DOB: 12-Mar-1976, 40 y.o.   MRN: [id number]  HPI  I have reviewed pt PMH, PSH, FH, Social History and Surgical History  Pt works at [employer]. Exercise just started, [employer], Some caffeine, Married with 3 children  Pt is cutting back on carbs and some daily exercise. She is going to gym 4 days a week.   Pt last papvsmear 1 yr ago and was normal. Pt will call her gyn to schedule exam this year. No family history of breast cancer.  Pt is overdue for tetanus.  Pt will get hiv screen today  Pt also mentions small abdominal bulge present for about one year. Small at first. No pain. But feels like is getting larger.       Review of Systems  Constitutional: Negative for fever, chills, activity change and fatigue.  HENT: Positive for congestion.   Respiratory: Negative for cough, chest tightness and shortness of breath.   Cardiovascular: Negative for chest pain and palpitations.  Gastrointestinal: Negative for nausea, vomiting and abdominal pain.  Musculoskeletal: Negative for neck pain and neck stiffness.  Skin: Negative for rash.  Neurological: Negative for dizziness, facial asymmetry, speech difficulty, weakness and numbness.  Hematological: Negative for adenopathy. Does not bruise/bleed easily.  Psychiatric/Behavioral: Negative for behavioral problems, confusion and agitation. The patient is not nervous/anxious.      Past Medical History  Diagnosis Date  . History of vertigo     history of intermittent vertigo  . GERD (gastroesophageal reflux disease)   . Allergy      Social History   Social History  . Marital Status: Married    Spouse Name: N/A  . Number of Children: N/A  . Years of Education: N/A   Occupational History  . Not on file.   Social History Main Topics  . Smoking status: Never Smoker   . Smokeless tobacco: Never Used  . Alcohol Use: Not on file  . Drug Use: Not on file  .  Sexual Activity: Not on file   Other Topics Concern  . Not on file   Social History Narrative   Married   Three children.   Speaks Spanish    Never Smoked    Alcohol use-yes   Regular exercise-no   Caffeine use/day:  1-2 beverages daily    Past Surgical History  Procedure Laterality Date  . Cesarean section      x 2  . Nasal sinus surgery    . Appendectomy      Family History  Problem Relation Age of Onset  . Coronary artery disease Maternal Aunt   . Diabetes Maternal Grandfather     No Known Allergies  Current Outpatient Prescriptions on File Prior to Visit  Medication Sig Dispense Refill  . omeprazole (PRILOSEC) 40 MG capsule Take 1 capsule (40 mg total) by mouth daily. 30 capsule 3   No current facility-administered medications on file prior to visit.    BP 100/74 mmHg  Pulse 60  Temp(Src) 98 F (36.7 C) (Oral)  Ht 5' 2.25" (1.581 m)  Wt 145 lb 12.8 oz (66.134 kg)  BMI 26.46 kg/m2  SpO2 99%  LMP 01/19/2016       Objective:   Physical Exam  General Mental Status- Alert. General Appearance- Not in acute distress.   Skin General: Color- Normal Color. Moisture- Normal Moisture.  Neck Carotid Arteries- Normal color. Moisture- Normal Moisture. No carotid bruits. No  JVD.  Chest and Lung Exam Auscultation: Breath Sounds:-Normal.  Cardiovascular Auscultation:Rythm- Regular. Murmurs & Other Heart Sounds:Auscultation of the heart reveals- No Murmurs.  Abdomen Inspection:-Inspeection Normal. Palpation/Percussion:Note:No mass(left of midline upper abdomen small mass.possible lipoma vs hernia. . Palpation and Percussion of the abdomen reveal- Non Tender, Non Distended + BS, no rebound or guarding.  Back- no cva tenderness.   Neurologic Cranial Nerve exam:- CN III-XII intact(No nystagmus), symmetric smile. Strength:- 5/5 equal and symmetric strength both upper and lower extremities.  Breast exam and gyn exam will be done with her gyn. Deferred  today.      Assessment & Plan:  Will get cbc, cmp, tsh, lipid, hiv screen,ua,  tdap for wellness exam today.  For your abdomen small mass will order abdominal ultrasound.  Appears in good health. Advised continue effort to diet, exercise and keep healthy bmi.  Follow up date to be determined after labs  For allergies continue allegra. If needed restart flonase.

## 2016-01-19 NOTE — Patient Instructions (Addendum)
Will get cbc, cmp, tsh, lipid,  hiv screen,ua,  tdap for wellness exam today.  Appears in good health. Advised continue effort to diet, exercise and keep healthy bmi.  For your abdomen small mass will order abdominal ultrasound.  For allergies continue allegra. If needed restart flonase.  Follow up date to be determined after lab review.  Preventive Care for Adults, Female A healthy lifestyle and preventive care can promote health and wellness. Preventive health guidelines for women include the following key practices.  A routine yearly physical is a good way to check with your health care provider about your health and preventive screening. It is a chance to share any concerns and updates on your health and to receive a thorough exam.  Visit your dentist for a routine exam and preventive care every 6 months. Brush your teeth twice a day and floss once a day. Good oral hygiene prevents tooth decay and gum disease.  The frequency of eye exams is based on your age, health, family medical history, use of contact lenses, and other factors. Follow your health care provider's recommendations for frequency of eye exams.  Eat a healthy diet. Foods like vegetables, fruits, whole grains, low-fat dairy products, and lean protein foods contain the nutrients you need without too many calories. Decrease your intake of foods high in solid fats, added sugars, and salt. Eat the right amount of calories for you.Get information about a proper diet from your health care provider, if necessary.  Regular physical exercise is one of the most important things you can do for your health. Most adults should get at least 150 minutes of moderate-intensity exercise (any activity that increases your heart rate and causes you to sweat) each week. In addition, most adults need muscle-strengthening exercises on 2 or more days a week.  Maintain a healthy weight. The body mass index (BMI) is a screening tool to identify  possible weight problems. It provides an estimate of body fat based on height and weight. Your health care provider can find your BMI and can help you achieve or maintain a healthy weight.For adults 20 years and older:  A BMI below 18.5 is considered underweight.  A BMI of 18.5 to 24.9 is normal.  A BMI of 25 to 29.9 is considered overweight.  A BMI of 30 and above is considered obese.  Maintain normal blood lipids and cholesterol levels by exercising and minimizing your intake of saturated fat. Eat a balanced diet with plenty of fruit and vegetables. Blood tests for lipids and cholesterol should begin at age 75 and be repeated every 5 years. If your lipid or cholesterol levels are high, you are over 50, or you are at high risk for heart disease, you may need your cholesterol levels checked more frequently.Ongoing high lipid and cholesterol levels should be treated with medicines if diet and exercise are not working.  If you smoke, find out from your health care provider how to quit. If you do not use tobacco, do not start.  Lung cancer screening is recommended for adults aged 91-80 years who are at high risk for developing lung cancer because of a history of smoking. A yearly low-dose CT scan of the lungs is recommended for people who have at least a 30-pack-year history of smoking and are a current smoker or have quit within the past 15 years. A pack year of smoking is smoking an average of 1 pack of cigarettes a day for 1 year (for example: 1 pack a  day for 30 years or 2 packs a day for 15 years). Yearly screening should continue until the smoker has stopped smoking for at least 15 years. Yearly screening should be stopped for people who develop a health problem that would prevent them from having lung cancer treatment.  If you are pregnant, do not drink alcohol. If you are breastfeeding, be very cautious about drinking alcohol. If you are not pregnant and choose to drink alcohol, do not have  more than 1 drink per day. One drink is considered to be 12 ounces (355 mL) of beer, 5 ounces (148 mL) of wine, or 1.5 ounces (44 mL) of liquor.  Avoid use of street drugs. Do not share needles with anyone. Ask for help if you need support or instructions about stopping the use of drugs.  High blood pressure causes heart disease and increases the risk of stroke. Your blood pressure should be checked at least every 1 to 2 years. Ongoing high blood pressure should be treated with medicines if weight loss and exercise do not work.  If you are 41-78 years old, ask your health care provider if you should take aspirin to prevent strokes.  Diabetes screening is done by taking a blood sample to check your blood glucose level after you have not eaten for a certain period of time (fasting). If you are not overweight and you do not have risk factors for diabetes, you should be screened once every 3 years starting at age 46. If you are overweight or obese and you are 63-42 years of age, you should be screened for diabetes every year as part of your cardiovascular risk assessment.  Breast cancer screening is essential preventive care for women. You should practice "breast self-awareness." This means understanding the normal appearance and feel of your breasts and may include breast self-examination. Any changes detected, no matter how small, should be reported to a health care provider. Women in their 64s and 30s should have a clinical breast exam (CBE) by a health care provider as part of a regular health exam every 1 to 3 years. After age 8, women should have a CBE every year. Starting at age 10, women should consider having a mammogram (breast X-ray test) every year. Women who have a family history of breast cancer should talk to their health care provider about genetic screening. Women at a high risk of breast cancer should talk to their health care providers about having an MRI and a mammogram every  year.  Breast cancer gene (BRCA)-related cancer risk assessment is recommended for women who have family members with BRCA-related cancers. BRCA-related cancers include breast, ovarian, tubal, and peritoneal cancers. Having family members with these cancers may be associated with an increased risk for harmful changes (mutations) in the breast cancer genes BRCA1 and BRCA2. Results of the assessment will determine the need for genetic counseling and BRCA1 and BRCA2 testing.  Your health care provider may recommend that you be screened regularly for cancer of the pelvic organs (ovaries, uterus, and vagina). This screening involves a pelvic examination, including checking for microscopic changes to the surface of your cervix (Pap test). You may be encouraged to have this screening done every 3 years, beginning at age 68.  For women ages 38-65, health care providers may recommend pelvic exams and Pap testing every 3 years, or they may recommend the Pap and pelvic exam, combined with testing for human papilloma virus (HPV), every 5 years. Some types of HPV increase your risk  of cervical cancer. Testing for HPV may also be done on women of any age with unclear Pap test results.  Other health care providers may not recommend any screening for nonpregnant women who are considered low risk for pelvic cancer and who do not have symptoms. Ask your health care provider if a screening pelvic exam is right for you.  If you have had past treatment for cervical cancer or a condition that could lead to cancer, you need Pap tests and screening for cancer for at least 20 years after your treatment. If Pap tests have been discontinued, your risk factors (such as having a new sexual partner) need to be reassessed to determine if screening should resume. Some women have medical problems that increase the chance of getting cervical cancer. In these cases, your health care provider may recommend more frequent screening and Pap  tests.  Colorectal cancer can be detected and often prevented. Most routine colorectal cancer screening begins at the age of 5 years and continues through age 54 years. However, your health care provider may recommend screening at an earlier age if you have risk factors for colon cancer. On a yearly basis, your health care provider may provide home test kits to check for hidden blood in the stool. Use of a small camera at the end of a tube, to directly examine the colon (sigmoidoscopy or colonoscopy), can detect the earliest forms of colorectal cancer. Talk to your health care provider about this at age 84, when routine screening begins. Direct exam of the colon should be repeated every 5-10 years through age 70 years, unless early forms of precancerous polyps or small growths are found.  People who are at an increased risk for hepatitis B should be screened for this virus. You are considered at high risk for hepatitis B if:  You were born in a country where hepatitis B occurs often. Talk with your health care provider about which countries are considered high risk.  Your parents were born in a high-risk country and you have not received a shot to protect against hepatitis B (hepatitis B vaccine).  You have HIV or AIDS.  You use needles to inject street drugs.  You live with, or have sex with, someone who has hepatitis B.  You get hemodialysis treatment.  You take certain medicines for conditions like cancer, organ transplantation, and autoimmune conditions.  Hepatitis C blood testing is recommended for all people born from [birth year] through 1965 and any individual with known risks for hepatitis C.  Practice safe sex. Use condoms and avoid high-risk sexual practices to reduce the spread of sexually transmitted infections (STIs). STIs include gonorrhea, chlamydia, syphilis, trichomonas, herpes, HPV, and human immunodeficiency virus (HIV). Herpes, HIV, and HPV are viral illnesses that have no cure.  They can result in disability, cancer, and death.  You should be screened for sexually transmitted illnesses (STIs) including gonorrhea and chlamydia if:  You are sexually active and are younger than 24 years.  You are older than 24 years and your health care provider tells you that you are at risk for this type of infection.  Your sexual activity has changed since you were last screened and you are at an increased risk for chlamydia or gonorrhea. Ask your health care provider if you are at risk.  If you are at risk of being infected with HIV, it is recommended that you take a prescription medicine daily to prevent HIV infection. This is called preexposure prophylaxis (PrEP). You are  considered at risk if:  You are sexually active and do not regularly use condoms or know the HIV status of your partner(s).  You take drugs by injection.  You are sexually active with a partner who has HIV.  Talk with your health care provider about whether you are at high risk of being infected with HIV. If you choose to begin PrEP, you should first be tested for HIV. You should then be tested every 3 months for as long as you are taking PrEP.  Osteoporosis is a disease in which the bones lose minerals and strength with aging. This can result in serious bone fractures or breaks. The risk of osteoporosis can be identified using a bone density scan. Women ages 20 years and over and women at risk for fractures or osteoporosis should discuss screening with their health care providers. Ask your health care provider whether you should take a calcium supplement or vitamin D to reduce the rate of osteoporosis.  Menopause can be associated with physical symptoms and risks. Hormone replacement therapy is available to decrease symptoms and risks. You should talk to your health care provider about whether hormone replacement therapy is right for you.  Use sunscreen. Apply sunscreen liberally and repeatedly throughout the  day. You should seek shade when your shadow is shorter than you. Protect yourself by wearing long sleeves, pants, a wide-brimmed hat, and sunglasses year round, whenever you are outdoors.  Once a month, do a whole body skin exam, using a mirror to look at the skin on your back. Tell your health care provider of new moles, moles that have irregular borders, moles that are larger than a pencil eraser, or moles that have changed in shape or color.  Stay current with required vaccines (immunizations).  Influenza vaccine. All adults should be immunized every year.  Tetanus, diphtheria, and acellular pertussis (Td, Tdap) vaccine. Pregnant women should receive 1 dose of Tdap vaccine during each pregnancy. The dose should be obtained regardless of the length of time since the last dose. Immunization is preferred during the 27th-36th week of gestation. An adult who has not previously received Tdap or who does not know her vaccine status should receive 1 dose of Tdap. This initial dose should be followed by tetanus and diphtheria toxoids (Td) booster doses every 10 years. Adults with an unknown or incomplete history of completing a 3-dose immunization series with Td-containing vaccines should begin or complete a primary immunization series including a Tdap dose. Adults should receive a Td booster every 10 years.  Varicella vaccine. An adult without evidence of immunity to varicella should receive 2 doses or a second dose if she has previously received 1 dose. Pregnant females who do not have evidence of immunity should receive the first dose after pregnancy. This first dose should be obtained before leaving the health care facility. The second dose should be obtained 4-8 weeks after the first dose.  Human papillomavirus (HPV) vaccine. Females aged 13-26 years who have not received the vaccine previously should obtain the 3-dose series. The vaccine is not recommended for use in pregnant females. However, pregnancy  testing is not needed before receiving a dose. If a female is found to be pregnant after receiving a dose, no treatment is needed. In that case, the remaining doses should be delayed until after the pregnancy. Immunization is recommended for any person with an immunocompromised condition through the age of 65 years if she did not get any or all doses earlier. During the 3-dose  series, the second dose should be obtained 4-8 weeks after the first dose. The third dose should be obtained 24 weeks after the first dose and 16 weeks after the second dose.  Zoster vaccine. One dose is recommended for adults aged 78 years or older unless certain conditions are present.  Measles, mumps, and rubella (MMR) vaccine. Adults born before [birth year] generally are considered immune to measles and mumps. Adults born in [birth year] or later should have 1 or more doses of MMR vaccine unless there is a contraindication to the vaccine or there is laboratory evidence of immunity to each of the three diseases. A routine second dose of MMR vaccine should be obtained at least 28 days after the first dose for students attending postsecondary schools, health care workers, or international travelers. People who received inactivated measles vaccine or an unknown type of measles vaccine during 1963-1967 should receive 2 doses of MMR vaccine. People who received inactivated mumps vaccine or an unknown type of mumps vaccine before 1979 and are at high risk for mumps infection should consider immunization with 2 doses of MMR vaccine. For females of childbearing age, rubella immunity should be determined. If there is no evidence of immunity, females who are not pregnant should be vaccinated. If there is no evidence of immunity, females who are pregnant should delay immunization until after pregnancy. Unvaccinated health care workers born before [birth year] who lack laboratory evidence of measles, mumps, or rubella immunity or laboratory confirmation of disease should  consider measles and mumps immunization with 2 doses of MMR vaccine or rubella immunization with 1 dose of MMR vaccine.  Pneumococcal 13-valent conjugate (PCV13) vaccine. When indicated, a person who is uncertain of his immunization history and has no record of immunization should receive the PCV13 vaccine. All adults 95 years of age and older should receive this vaccine. An adult aged 21 years or older who has certain medical conditions and has not been previously immunized should receive 1 dose of PCV13 vaccine. This PCV13 should be followed with a dose of pneumococcal polysaccharide (PPSV23) vaccine. Adults who are at high risk for pneumococcal disease should obtain the PPSV23 vaccine at least 8 weeks after the dose of PCV13 vaccine. Adults older than 39 years of age who have normal immune system function should obtain the PPSV23 vaccine dose at least 1 year after the dose of PCV13 vaccine.  Pneumococcal polysaccharide (PPSV23) vaccine. When PCV13 is also indicated, PCV13 should be obtained first. All adults aged 86 years and older should be immunized. An adult younger than age 38 years who has certain medical conditions should be immunized. Any person who resides in a nursing home or long-term care facility should be immunized. An adult smoker should be immunized. People with an immunocompromised condition and certain other conditions should receive both PCV13 and PPSV23 vaccines. People with human immunodeficiency virus (HIV) infection should be immunized as soon as possible after diagnosis. Immunization during chemotherapy or radiation therapy should be avoided. Routine use of PPSV23 vaccine is not recommended for American Indians, [place] Natives, or people younger than 65 years unless there are medical conditions that require PPSV23 vaccine. When indicated, people who have unknown immunization and have no record of immunization should receive PPSV23 vaccine. One-time revaccination 5 years after the first  dose of PPSV23 is recommended for people aged 19-64 years who have chronic kidney failure, nephrotic syndrome, asplenia, or immunocompromised conditions. People who received 1-2 doses of PPSV23 before age 62 years should receive another dose of PPSV23 vaccine  at age 77 years or later if at least 5 years have passed since the previous dose. Doses of PPSV23 are not needed for people immunized with PPSV23 at or after age 64 years.  Meningococcal vaccine. Adults with asplenia or persistent complement component deficiencies should receive 2 doses of quadrivalent meningococcal conjugate (MenACWY-D) vaccine. The doses should be obtained at least 2 months apart. Microbiologists working with certain meningococcal bacteria, [employer] recruits, people at risk during an outbreak, and people who travel to or live in countries with a high rate of meningitis should be immunized. A first-year college student up through age 33 years who is living in a residence hall should receive a dose if she did not receive a dose on or after her 16th birthday. Adults who have certain high-risk conditions should receive one or more doses of vaccine.  Hepatitis A vaccine. Adults who wish to be protected from this disease, have certain high-risk conditions, work with hepatitis A-infected animals, work in hepatitis A research labs, or travel to or work in countries with a high rate of hepatitis A should be immunized. Adults who were previously unvaccinated and who anticipate close contact with an international adoptee during the first 60 days after arrival in the [place] from a country with a high rate of hepatitis A should be immunized.  Hepatitis B vaccine. Adults who wish to be protected from this disease, have certain high-risk conditions, may be exposed to blood or other infectious body fluids, are household contacts or sex partners of hepatitis B positive people, are clients or workers in certain care facilities, or travel to or  work in countries with a high rate of hepatitis B should be immunized.  Haemophilus influenzae type b (Hib) vaccine. A previously unvaccinated person with asplenia or sickle cell disease or having a scheduled splenectomy should receive 1 dose of Hib vaccine. Regardless of previous immunization, a recipient of a hematopoietic stem cell transplant should receive a 3-dose series 6-12 months after her successful transplant. Hib vaccine is not recommended for adults with HIV infection. Preventive Services / Frequency Ages 38 to 52 years  Blood pressure check.** / Every 3-5 years.  Lipid and cholesterol check.** / Every 5 years beginning at age 45.  Clinical breast exam.** / Every 3 years for women in their 30s and 65s.  BRCA-related cancer risk assessment.** / For women who have family members with a BRCA-related cancer (breast, ovarian, tubal, or peritoneal cancers).  Pap test.** / Every 2 years from ages 7 through 48. Every 3 years starting at age 68 through age 20 or 43 with a history of 3 consecutive normal Pap tests.  HPV screening.** / Every 3 years from ages 71 through ages 68 to 42 with a history of 3 consecutive normal Pap tests.  Hepatitis C blood test.** / For any individual with known risks for hepatitis C.  Skin self-exam. / Monthly.  Influenza vaccine. / Every year.  Tetanus, diphtheria, and acellular pertussis (Tdap, Td) vaccine.** / Consult your health care provider. Pregnant women should receive 1 dose of Tdap vaccine during each pregnancy. 1 dose of Td every 10 years.  Varicella vaccine.** / Consult your health care provider. Pregnant females who do not have evidence of immunity should receive the first dose after pregnancy.  HPV vaccine. / 3 doses over 6 months, if 32 and younger. The vaccine is not recommended for use in pregnant females. However, pregnancy testing is not needed before receiving a dose.  Measles, mumps, rubella (MMR)  vaccine.** / You need at least 1 dose  of MMR if you were born in 1957 or later. You may also need a 2nd dose. For females of childbearing age, rubella immunity should be determined. If there is no evidence of immunity, females who are not pregnant should be vaccinated. If there is no evidence of immunity, females who are pregnant should delay immunization until after pregnancy.  Pneumococcal 13-valent conjugate (PCV13) vaccine.** / Consult your health care provider.  Pneumococcal polysaccharide (PPSV23) vaccine.** / 1 to 2 doses if you smoke cigarettes or if you have certain conditions.  Meningococcal vaccine.** / 1 dose if you are age 6 to 12 years and a [employer] researcher living in a residence hall, or have one of several medical conditions, you need to get vaccinated against meningococcal disease. You may also need additional booster doses.  Hepatitis A vaccine.** / Consult your health care provider.  Hepatitis B vaccine.** / Consult your health care provider.  Haemophilus influenzae type b (Hib) vaccine.** / Consult your health care provider. Ages 60 to 90 years  Blood pressure check.** / Every year.  Lipid and cholesterol check.** / Every 5 years beginning at age 43 years.  Lung cancer screening. / Every year if you are aged 54-80 years and have a 30-pack-year history of smoking and currently smoke or have quit within the past 15 years. Yearly screening is stopped once you have quit smoking for at least 15 years or develop a health problem that would prevent you from having lung cancer treatment.  Clinical breast exam.** / Every year after age 30 years.  BRCA-related cancer risk assessment.** / For women who have family members with a BRCA-related cancer (breast, ovarian, tubal, or peritoneal cancers).  Mammogram.** / Every year beginning at age 31 years and continuing for as long as you are in good health. Consult with your health care provider.  Pap test.** / Every 3 years starting at age 5 years through age  81 or 80 years with a history of 3 consecutive normal Pap tests.  HPV screening.** / Every 3 years from ages 41 years through ages 8 to 18 years with a history of 3 consecutive normal Pap tests.  Fecal occult blood test (FOBT) of stool. / Every year beginning at age 26 years and continuing until age 59 years. You may not need to do this test if you get a colonoscopy every 10 years.  Flexible sigmoidoscopy or colonoscopy.** / Every 5 years for a flexible sigmoidoscopy or every 10 years for a colonoscopy beginning at age 73 years and continuing until age 70 years.  Hepatitis C blood test.** / For all people born from [birth year] through 1964 and any individual with known risks for hepatitis C.  Skin self-exam. / Monthly.  Influenza vaccine. / Every year.  Tetanus, diphtheria, and acellular pertussis (Tdap/Td) vaccine.** / Consult your health care provider. Pregnant women should receive 1 dose of Tdap vaccine during each pregnancy. 1 dose of Td every 10 years.  Varicella vaccine.** / Consult your health care provider. Pregnant females who do not have evidence of immunity should receive the first dose after pregnancy.  Zoster vaccine.** / 1 dose for adults aged 80 years or older.  Measles, mumps, rubella (MMR) vaccine.** / You need at least 1 dose of MMR if you were born in 1957 or later. You may also need a second dose. For females of childbearing age, rubella immunity should be determined. If there is no evidence of immunity, females  who are not pregnant should be vaccinated. If there is no evidence of immunity, females who are pregnant should delay immunization until after pregnancy.  Pneumococcal 13-valent conjugate (PCV13) vaccine.** / Consult your health care provider.  Pneumococcal polysaccharide (PPSV23) vaccine.** / 1 to 2 doses if you smoke cigarettes or if you have certain conditions.  Meningococcal vaccine.** / Consult your health care provider.  Hepatitis A vaccine.** / Consult your  health care provider.  Hepatitis B vaccine.** / Consult your health care provider.  Haemophilus influenzae type b (Hib) vaccine.** / Consult your health care provider. Ages 30 years and over  Blood pressure check.** / Every year.  Lipid and cholesterol check.** / Every 5 years beginning at age 57 years.  Lung cancer screening. / Every year if you are aged 62-80 years and have a 30-pack-year history of smoking and currently smoke or have quit within the past 15 years. Yearly screening is stopped once you have quit smoking for at least 15 years or develop a health problem that would prevent you from having lung cancer treatment.  Clinical breast exam.** / Every year after age 20 years.  BRCA-related cancer risk assessment.** / For women who have family members with a BRCA-related cancer (breast, ovarian, tubal, or peritoneal cancers).  Mammogram.** / Every year beginning at age 65 years and continuing for as long as you are in good health. Consult with your health care provider.  Pap test.** / Every 3 years starting at age 23 years through age 1 or 98 years with 3 consecutive normal Pap tests. Testing can be stopped between 65 and 70 years with 3 consecutive normal Pap tests and no abnormal Pap or HPV tests in the past 10 years.  HPV screening.** / Every 3 years from ages 74 years through ages 68 or 11 years with a history of 3 consecutive normal Pap tests. Testing can be stopped between 65 and 70 years with 3 consecutive normal Pap tests and no abnormal Pap or HPV tests in the past 10 years.  Fecal occult blood test (FOBT) of stool. / Every year beginning at age 3 years and continuing until age 43 years. You may not need to do this test if you get a colonoscopy every 10 years.  Flexible sigmoidoscopy or colonoscopy.** / Every 5 years for a flexible sigmoidoscopy or every 10 years for a colonoscopy beginning at age 50 years and continuing until age 93 years.  Hepatitis C blood test.** /  For all people born from [birth year] through 1964 and any individual with known risks for hepatitis C.  Osteoporosis screening.** / A one-time screening for women ages 60 years and over and women at risk for fractures or osteoporosis.  Skin self-exam. / Monthly.  Influenza vaccine. / Every year.  Tetanus, diphtheria, and acellular pertussis (Tdap/Td) vaccine.** / 1 dose of Td every 10 years.  Varicella vaccine.** / Consult your health care provider.  Zoster vaccine.** / 1 dose for adults aged 68 years or older.  Pneumococcal 13-valent conjugate (PCV13) vaccine.** / Consult your health care provider.  Pneumococcal polysaccharide (PPSV23) vaccine.** / 1 dose for all adults aged 52 years and older.  Meningococcal vaccine.** / Consult your health care provider.  Hepatitis A vaccine.** / Consult your health care provider.  Hepatitis B vaccine.** / Consult your health care provider.  Haemophilus influenzae type b (Hib) vaccine.** / Consult your health care provider. ** Family history and personal history of risk and conditions may change your health care provider's recommendations.  This information is not intended to replace advice given to you by your health care provider. Make sure you discuss any questions you have with your health care provider.   Document Released: 11/01/2001 Document Revised: 09/26/2014 Document Reviewed: 01/31/2011 Elsevier Interactive Patient Education [employer].

## 2016-01-20 ENCOUNTER — Telehealth: Payer: Self-pay | Admitting: Medical

## 2016-01-20 DIAGNOSIS — R222 Localized swelling, mass and lump, trunk: Secondary | ICD-10-CM

## 2016-01-20 LAB — HIV ANTIBODY (ROUTINE TESTING W REFLEX): HIV 1&2 Ab, 4th Generation: NONREACTIVE

## 2016-01-20 NOTE — Telephone Encounter (Signed)
Placed new order at instruction of radiology. Please see and coordinate

## 2016-01-30 ENCOUNTER — Ambulatory Visit (HOSPITAL_BASED_OUTPATIENT_CLINIC_OR_DEPARTMENT_OTHER)
Admission: RE | Admit: 2016-01-30 | Discharge: 2016-01-30 | Disposition: A | Discharge status: Home / Self Care | Payer: Managed Care, Other (non HMO) | Source: Ambulatory Visit | Attending: Medical | Admitting: Medical

## 2016-01-30 DIAGNOSIS — R222 Localized swelling, mass and lump, trunk: Secondary | ICD-10-CM

## 2016-01-30 DIAGNOSIS — R1900 Intra-abdominal and pelvic swelling, mass and lump, unspecified site: Secondary | ICD-10-CM | POA: Insufficient documentation

## 2016-01-31 ENCOUNTER — Telehealth: Payer: Self-pay | Admitting: Medical

## 2016-01-31 DIAGNOSIS — R1900 Intra-abdominal and pelvic swelling, mass and lump, unspecified site: Secondary | ICD-10-CM

## 2016-01-31 NOTE — Telephone Encounter (Signed)
Referral to surgeon placed. 

## 2016-02-01 NOTE — Telephone Encounter (Signed)
Can be reached: [phone]   Reason for call: pt called stating general surgeon called her. She is confused because she thought everything is fine. She is requesting a call back.

## 2016-02-02 NOTE — Telephone Encounter (Signed)
I reviewed report that they were waiting for my call. I advised them on answering machine regarding clinical reasoning why wanted to get surgeon referral/opinion rather than getting ct or mri. I called both mobile and cell phone. Since they were anxious to here from me I left the message. Call them tomorrow and touch base with them If they want me to cancel general surgery appointment let me know and will do so. If they want to talk with me when you call them I am more than happy to talk with them.

## 2016-02-02 NOTE — Telephone Encounter (Signed)
Spoke with pt's husband Jeanette Jackson and he is concerned that his wife has an appointment with GI and she has not been able to get a definite answer as to why she would need the appointment and why is she not able to cancel the appointment at this time and the pt was told that she could not cancel the appointment and our office would have to cancel the appointment. Patients husband Jeanette Jackson is concerned that he has not got a call back from the provider about the patients referral. Jeanette Jackson is requesting a call back tomorrow 02/03/16 at the latest before lunch if possible. Please advise.

## 2016-02-03 ENCOUNTER — Telehealth: Payer: Self-pay | Admitting: Medical

## 2016-02-03 NOTE — Telephone Encounter (Signed)
Talked with pt. Pt wants to cancel the referral to the surgeon. Please let surgeon office  know. Pt stats if she has pain in area in question or area gets larger then she will let us know.

## 2016-02-04 NOTE — Telephone Encounter (Signed)
Will call and cancel

## 2016-05-26 ENCOUNTER — Ambulatory Visit (INDEPENDENT_AMBULATORY_CARE_PROVIDER_SITE_OTHER): Payer: Managed Care, Other (non HMO) | Admitting: Medical

## 2016-05-26 ENCOUNTER — Encounter: Payer: Self-pay | Admitting: Medical

## 2016-05-26 VITALS — BP 106/60 | HR 82 | Temp 98.3°F | Ht 62.25 in | Wt 144.6 lb

## 2016-05-26 DIAGNOSIS — R0789 Other chest pain: Secondary | ICD-10-CM

## 2016-05-26 DIAGNOSIS — R350 Frequency of micturition: Secondary | ICD-10-CM

## 2016-05-26 DIAGNOSIS — R109 Unspecified abdominal pain: Secondary | ICD-10-CM

## 2016-05-26 LAB — CBC WITH DIFFERENTIAL/PLATELET
BASOS PCT: 0.5 % (ref 0.0–3.0)
Basophils Absolute: 0 10*3/uL (ref 0.0–0.1)
EOS ABS: 0 10*3/uL (ref 0.0–0.7)
Eosinophils Relative: 0.4 % (ref 0.0–5.0)
HCT: 36.8 % (ref 36.0–46.0)
HEMOGLOBIN: 12.5 g/dL (ref 12.0–15.0)
LYMPHS ABS: 1.9 10*3/uL (ref 0.7–4.0)
Lymphocytes Relative: 32.2 % (ref 12.0–46.0)
MCHC: 34 g/dL (ref 30.0–36.0)
MCV: 88.6 fl (ref 78.0–100.0)
MONO ABS: 0.5 10*3/uL (ref 0.1–1.0)
Monocytes Relative: 8.4 % (ref 3.0–12.0)
NEUTROS ABS: 3.5 10*3/uL (ref 1.4–7.7)
NEUTROS PCT: 58.5 % (ref 43.0–77.0)
PLATELETS: 250 10*3/uL (ref 150.0–400.0)
RBC: 4.16 Mil/uL (ref 3.87–5.11)
RDW: 14.2 % (ref 11.5–15.5)
WBC: 6 10*3/uL (ref 4.0–10.5)

## 2016-05-26 LAB — POCT URINALYSIS DIPSTICK
Bilirubin, UA: NEGATIVE
GLUCOSE UA: NEGATIVE
KETONES UA: NEGATIVE
Leukocytes, UA: NEGATIVE
Nitrite, UA: NEGATIVE
Protein, UA: NEGATIVE
RBC UA: NEGATIVE
UROBILINOGEN UA: 4
pH, UA: 6

## 2016-05-26 LAB — COMPREHENSIVE METABOLIC PANEL
ALT: 10 U/L (ref 0–35)
AST: 14 U/L (ref 0–37)
Albumin: 4.6 g/dL (ref 3.5–5.2)
Alkaline Phosphatase: 52 U/L (ref 39–117)
BUN: 10 mg/dL (ref 6–23)
CHLORIDE: 101 meq/L (ref 96–112)
CO2: 33 meq/L — AB (ref 19–32)
CREATININE: 0.56 mg/dL (ref 0.40–1.20)
Calcium: 9.5 mg/dL (ref 8.4–10.5)
GFR: 127.86 mL/min (ref >60.00)
GLUCOSE: 82 mg/dL (ref 70–99)
POTASSIUM: 3.9 meq/L (ref 3.5–5.1)
SODIUM: 136 meq/L (ref 135–145)
Total Bilirubin: 0.4 mg/dL (ref 0.2–1.2)
Total Protein: 7.4 g/dL (ref 6.0–8.3)

## 2016-05-26 LAB — LIPASE: Lipase: 34 U/L (ref 11.0–59.0)

## 2016-05-26 LAB — AMYLASE: Amylase: 56 U/L (ref 27–131)

## 2016-05-26 MED ORDER — TRAMADOL HCL 50 MG PO TABS: 50.0000 mg | ORAL_TABLET | Freq: Four times a day (QID) | ORAL | 0 refills | Status: DC | PRN

## 2016-05-26 NOTE — Patient Instructions (Signed)
For your abdomen pain will recommend continuing omeprazole 40 mg a day and restarting ranitidine 150 mg twice daily(you have at home). Gerd diet and no alcohol. Will get labs today.   For frequent urination will get urine culture.  With your back pain last week with some flank area pain will give limited number of tramadol. This is in case pain returns. But also let us know since if does return then may get imaging studies of abd/pelvis(CT) to evaluate if kidney stone. Unlikely stone presently since no blood in urine. But your location and level of pain other day may indicate possible stone.  For atypical chest pain on Friday we got ekg today. Looks ok. But if you get chest area again recommend ED eval. Also if pain returning will recommend getting cardiologist evaluation.  Follow up in 7-10 days or as needed

## 2016-05-26 NOTE — Progress Notes (Signed)
Subjective:    Patient ID: Jeanette Jackson, female    DOB: [birth year], 40 y.o.   MRN: [id number]  HPI   Pt in states some pain in abdomen last monday. She thought it was reflux. She took omepazole 40 mg every day for 3 days. Pt took some azithromycin for 3 days and did not get better. Shegot zpack med from [place]. Pt has pain left side abdomen and some back pain. No rash to her skin. Pt states pain little deep. She grabs her luq and flank area. Pain is constant but at times is worse. Pt not sure if eating makes pain in worse.   Pt was eating fried food ans some alcohol.  LMP- past Thursday and came when she thought it should.  No history of kidney stone. Pt has no blood in urine on check today.  Some faint pain on urination this weekend. And slight decreased volume.  Pt pain level now level 4/10.  The other day she had brief pressure left side of chest on Friday night. Lasted about 30 minutes and then subsided. No jaw pain. No arm pain. No sweating.    Review of Systems  Constitutional: Negative for chills, fatigue and fever.  Respiratory: Negative for cough, shortness of breath and wheezing.   Cardiovascular: Negative for chest pain and palpitations.       None now but see hpi.  Gastrointestinal: Positive for abdominal pain. Negative for abdominal distention, anal bleeding, blood in stool, diarrhea, nausea and vomiting.  Genitourinary: Positive for frequency. Negative for difficulty urinating, dysuria, hematuria, urgency and vaginal pain.  Musculoskeletal: Positive for back pain.       Non now but had some last week.  Skin: Negative for rash.  Neurological: Negative for dizziness and headaches.  Hematological: Negative for adenopathy. Does not bruise/bleed easily.  Psychiatric/Behavioral: Negative for behavioral problems and confusion.   Past Medical History:  Diagnosis Date  . Allergy   . GERD (gastroesophageal reflux disease)   . History of vertigo    history of  intermittent vertigo     Social History   Social History  . Marital status: Married    Spouse name: N/A  . Number of children: N/A  . Years of education: N/A   Occupational History  . Not on file.   Social History Main Topics  . Smoking status: Never Smoker  . Smokeless tobacco: Never Used  . Alcohol use Not on file  . Drug use: Unknown  . Sexual activity: Not on file   Other Topics Concern  . Not on file   Social History Narrative   Married   Three children.   Speaks Spanish    Never Smoked    Alcohol use-yes   Regular exercise-no   Caffeine use/day:  1-2 beverages daily    Past Surgical History:  Procedure Laterality Date  . APPENDECTOMY    . CESAREAN SECTION     x 2  . NASAL SINUS SURGERY      Family History  Problem Relation Age of Onset  . Coronary artery disease Maternal Aunt   . Diabetes Maternal Grandfather     No Known Allergies  Current Outpatient Prescriptions on File Prior to Visit  Medication Sig Dispense Refill  . omeprazole (PRILOSEC) 40 MG capsule Take 1 capsule (40 mg total) by mouth daily. 30 capsule 3   No current facility-administered medications on file prior to visit.     BP 106/60   Pulse 82  Temp 98.3 F (36.8 C) (Oral)   Ht 5' 2.25" (1.581 m)   Wt 144 lb 9.6 oz (65.6 kg)   LMP 05/19/2016   SpO2 100%   BMI 26.24 kg/m       Objective:   Physical Exam   General Appearance- Not in acute distress.  HEENT Eyes- Scleraeral/Conjuntiva-bilat- Not Yellow. Mouth & Throat- Normal.  Chest and Lung Exam Auscultation: Breath sounds:-Normal. Adventitious sounds:- No Adventitious sounds.  Cardiovascular Auscultation:Rythm - Regular. Heart Sounds -Normal heart sounds.  Abdomen Inspection:-Inspection Normal.  Palpation/Perucssion: Palpation and Percussion of the abdomen reveal- faint epigasric Tender, No Rebound tenderness, No rigidity(Guarding) and No Palpable abdominal masses.  Liver:-Normal.  Spleen:- Normal.    Back- no cva pain.     Assessment & Plan:  Pt ekg showed sinus rhythm. rsr v1.  For your abdomen pain will recommend continuing omeprazole 40 mg a day and restarting ranitidine 150 mg twice daily(you have at home). Gerd diet and no alcohol. Will get labs today.   For frequent urination will get urine culture.  With your back pain last week with some flank area pain will give limited number of tramadol. This is in case pain returns. But also let us know since if does return then may get imaging studies of abd/pelvis(CT) to evaluate if kidney stone. Unlikely stone presently since no blood in urine. But your location and level of pain other day may indicate possible stone.  For atypical chest pain on Friday we got ekg today. Looks ok. But if you get chest area again recommend ED eval. Also if pain returning will recommend getting cardiologist evaluation.  Follow up in 7-10 days or as needed  Jaquavius Hudler, Ramon DredgeEdward, VF CorporationPA-C

## 2016-05-27 LAB — H. PYLORI BREATH TEST: H. pylori Breath Test: DETECTED — AB

## 2016-05-27 LAB — URINE CULTURE: ORGANISM ID, BACTERIA: NO GROWTH

## 2016-05-29 ENCOUNTER — Encounter: Payer: Self-pay | Admitting: Medical

## 2016-05-29 ENCOUNTER — Telehealth: Payer: Self-pay | Admitting: Medical

## 2016-05-29 MED ORDER — CLARITHROMYCIN 500 MG PO TABS: 500.0000 mg | ORAL_TABLET | Freq: Two times a day (BID) | ORAL | 0 refills | Status: DC

## 2016-05-29 MED ORDER — AMOXICILLIN 875 MG PO TABS: 875.0000 mg | ORAL_TABLET | Freq: Two times a day (BID) | ORAL | 0 refills | Status: DC

## 2016-05-29 MED ORDER — OMEPRAZOLE 40 MG PO CPDR: 40.0000 mg | DELAYED_RELEASE_CAPSULE | Freq: Every day | ORAL | 3 refills | Status: DC

## 2016-05-29 NOTE — Telephone Encounter (Signed)
Opened to send rx for h pylori.

## 2016-06-15 ENCOUNTER — Ambulatory Visit (INDEPENDENT_AMBULATORY_CARE_PROVIDER_SITE_OTHER): Payer: Managed Care, Other (non HMO) | Admitting: Medical

## 2016-06-15 VITALS — BP 104/71 | HR 73 | Temp 98.2°F | Ht 62.25 in | Wt 144.2 lb

## 2016-06-15 DIAGNOSIS — Z8719 Personal history of other diseases of the digestive system: Secondary | ICD-10-CM

## 2016-06-15 DIAGNOSIS — R1013 Epigastric pain: Secondary | ICD-10-CM

## 2016-06-15 DIAGNOSIS — Z8619 Personal history of other infectious and parasitic diseases: Secondary | ICD-10-CM

## 2016-06-15 LAB — CBC WITH DIFFERENTIAL/PLATELET
BASOS PCT: 0.5 % (ref 0.0–3.0)
Basophils Absolute: 0 10*3/uL (ref 0.0–0.1)
EOS PCT: 0.3 % (ref 0.0–5.0)
Eosinophils Absolute: 0 10*3/uL (ref 0.0–0.7)
HEMATOCRIT: 36.7 % (ref 36.0–46.0)
HEMOGLOBIN: 12.5 g/dL (ref 12.0–15.0)
LYMPHS PCT: 32.6 % (ref 12.0–46.0)
Lymphs Abs: 2 10*3/uL (ref 0.7–4.0)
MCHC: 34.1 g/dL (ref 30.0–36.0)
MCV: 88.4 fl (ref 78.0–100.0)
MONOS PCT: 8.1 % (ref 3.0–12.0)
Monocytes Absolute: 0.5 10*3/uL (ref 0.1–1.0)
Neutro Abs: 3.7 10*3/uL (ref 1.4–7.7)
Neutrophils Relative %: 58.5 % (ref 43.0–77.0)
Platelets: 236 10*3/uL (ref 150.0–400.0)
RBC: 4.16 Mil/uL (ref 3.87–5.11)
RDW: 13.9 % (ref 11.5–15.5)
WBC: 6.2 10*3/uL (ref 4.0–10.5)

## 2016-06-15 LAB — COMPREHENSIVE METABOLIC PANEL
ALBUMIN: 4.5 g/dL (ref 3.5–5.2)
ALK PHOS: 55 U/L (ref 39–117)
ALT: 9 U/L (ref 0–35)
AST: 15 U/L (ref 0–37)
BUN: 8 mg/dL (ref 6–23)
CALCIUM: 9.3 mg/dL (ref 8.4–10.5)
CO2: 29 mEq/L (ref 19–32)
Chloride: 104 mEq/L (ref 96–112)
Creatinine, Ser: 0.56 mg/dL (ref 0.40–1.20)
GFR: 127.83 mL/min (ref >60.00)
Glucose, Bld: 88 mg/dL (ref 70–99)
POTASSIUM: 4.7 meq/L (ref 3.5–5.1)
SODIUM: 138 meq/L (ref 135–145)
TOTAL PROTEIN: 7.4 g/dL (ref 6.0–8.3)
Total Bilirubin: 0.4 mg/dL (ref 0.2–1.2)

## 2016-06-15 LAB — AMYLASE: AMYLASE: 58 U/L (ref 27–131)

## 2016-06-15 LAB — LIPASE: LIPASE: 32 U/L (ref 11.0–59.0)

## 2016-06-15 NOTE — Progress Notes (Signed)
Pre visit review using our clinic tool,if applicable. No additional management support is needed unless otherwise documented below in the visit note.  

## 2016-06-15 NOTE — Progress Notes (Signed)
Subjective:    Patient ID: Jeanette Jackson, female    DOB: October 20, 1975, 40 y.o.   MRN: [id number]  HPI   Pt in for follow up.   Pt states she still has some epigastric pain after finishing antibiotics for h pylori.   Pt also had positive h pylori in April and did get better post treatment.  Pt is still taking omeprazole.   Pt pt states pain is not constant now but when feel the pain is the same. Pt states she is eating healthy and pain not associated with eating. She has some pain during exam mild. No skin rash on abdomen or flank area. Pain despite antibiotic tx for h pylori. She is still on omeprazole. She thinks she had egd in [place]. She thinks maybe had ulcer. About 16 years ago. She does not have report or access to that report.  LMP- May 19, 2016.  Pt states some pain recently yesterday in epigastric area. Then some pain in left flank and ovary area. Pain lt adenxal area. Pt states she has history of endometriosis. She stopped her mirena last year. She decided not to place it back again. Pt thinks in past may have had ovarian cyst. Pt states cycles are real heavy when has. Pt last pap was normal one year ago. Pt gynecologist is Dr. Tawanna Cooler Messinger.(pt states with her insurance she does not need a referral).      Review of Systems  Constitutional: Negative for chills, fatigue and fever.  HENT: Negative for congestion and ear pain.   Respiratory: Negative for cough, chest tightness, shortness of breath and wheezing.   Cardiovascular: Negative for chest pain and palpitations.  Gastrointestinal: Positive for abdominal pain. Negative for abdominal distention, anal bleeding, blood in stool, constipation, diarrhea, nausea and vomiting.  Musculoskeletal: Negative for back pain.  Skin: Negative for rash.  Neurological: Negative for dizziness and headaches.  Hematological: Negative for adenopathy. Does not bruise/bleed easily.  Psychiatric/Behavioral: Negative for behavioral  problems and confusion.    Past Medical History:  Diagnosis Date  . Allergy   . GERD (gastroesophageal reflux disease)   . History of vertigo    history of intermittent vertigo     Social History   Social History  . Marital status: Married    Spouse name: N/A  . Number of children: N/A  . Years of education: N/A   Occupational History  . Not on file.   Social History Main Topics  . Smoking status: Never Smoker  . Smokeless tobacco: Never Used  . Alcohol use Not on file  . Drug use: Unknown  . Sexual activity: Not on file   Other Topics Concern  . Not on file   Social History Narrative   Married   Three children.   Speaks Spanish    Never Smoked    Alcohol use-yes   Regular exercise-no   Caffeine use/day:  1-2 beverages daily    Past Surgical History:  Procedure Laterality Date  . APPENDECTOMY    . CESAREAN SECTION     x 2  . NASAL SINUS SURGERY      Family History  Problem Relation Age of Onset  . Coronary artery disease Maternal Aunt   . Diabetes Maternal Grandfather     No Known Allergies  Current Outpatient Prescriptions on File Prior to Visit  Medication Sig Dispense Refill  . ibuprofen (ADVIL,MOTRIN) 200 MG tablet Take 200 mg by mouth every 6 (six) hours as needed for moderate  pain. Patient states she takes 3  200mg  tabs as needed    . omeprazole (PRILOSEC) 40 MG capsule Take 1 capsule (40 mg total) by mouth daily. 30 capsule 3  . traMADol (ULTRAM) 50 MG tablet Take 1 tablet (50 mg total) by mouth every 6 (six) hours as needed. (Patient not taking: Reported on 06/15/2016) 6 tablet 0   No current facility-administered medications on file prior to visit.     BP 104/71   Pulse 73   Temp 98.2 F (36.8 C) (Oral)   Ht 5' 2.25" (1.581 m)   Wt 144 lb 3.2 oz (65.4 kg)   LMP 05/19/2016   SpO2 100%   BMI 26.16 kg/m       Objective:   Physical Exam   General Appearance- Not in acute distress.  HEENT Eyes- Scleraeral/Conjuntiva-bilat- Not  Yellow. Mouth & Throat- Normal.  Chest and Lung Exam Auscultation: Breath sounds:-Normal. Adventitious sounds:- No Adventitious sounds.  Cardiovascular Auscultation:Rythm - Regular. Heart Sounds -Normal heart sounds.  Abdomen Inspection:-Inspection Normal.  Palpation/Perucssion: Palpation and Percussion of the abdomen reveal- faint epigastric  Tender, No Rebound tenderness, No rigidity(Guarding) and No Palpable abdominal masses.  Liver:-Normal.  Spleen:- Normal.  Faint llq region/adenexal  tenderness to palpation.  Back- no cva tenderness.     Assessment & Plan:  For your abdomen pain will repeat h- pylori and labs today. Including Ifob test.  Continue the omeprazole rx. Gerd diet guidelines.  I will go ahead and refer to GI    Regarding transient left lower quadrant area pain this may be ovary pain or endometriosis type pain as you thought. You are going to self refer to your GYN. I want you to notify me of your appointment date. If appointment delayed and pain more constant or intense then will go ahead and order US studies.  Follow up with me in 3-4 weeks or as needed  Kashmir Lysaght, Ramon DredgeEdward, [place] JerseyPA-C

## 2016-06-15 NOTE — Patient Instructions (Addendum)
For your abdomen pain will repeat h- pylori and labs today. Including Ifob test.  Continue the omeprazole rx. Gerd diet guidelines.  I will go ahead and refer to GI    Regarding transient left lower quadrant area pain this may be ovary pain or endometriosis type pain as you thought. You are going to self refer to your GYN. I want you to notify me of your appointment date. If appointment delayed and pain more constant or intense then will go ahead and order US studies.  Follow up with me in 3-4 weeks or as needed

## 2016-06-15 NOTE — Progress Notes (Signed)
..  lb

## 2016-06-16 LAB — H. PYLORI BREATH TEST: H. PYLORI BREATH TEST: DETECTED — AB

## 2016-06-18 ENCOUNTER — Telehealth: Payer: Self-pay | Admitting: Medical

## 2016-06-18 DIAGNOSIS — A048 Other specified bacterial intestinal infections: Secondary | ICD-10-CM

## 2016-06-18 NOTE — Telephone Encounter (Signed)
Referral to GI placed

## 2016-06-20 ENCOUNTER — Encounter: Payer: Self-pay | Admitting: Gastroenterology

## 2016-06-28 ENCOUNTER — Telehealth: Payer: Self-pay | Admitting: Medical

## 2016-06-28 NOTE — Telephone Encounter (Signed)
Please review result night turn around time. Thanks, Jeanette DredgeEdward

## 2016-06-28 NOTE — Telephone Encounter (Signed)
Noted  

## 2016-06-29 ENCOUNTER — Encounter: Payer: Self-pay | Admitting: Gastroenterology

## 2016-06-29 ENCOUNTER — Telehealth: Payer: Self-pay | Admitting: Emergency Medicine

## 2016-06-29 ENCOUNTER — Ambulatory Visit (INDEPENDENT_AMBULATORY_CARE_PROVIDER_SITE_OTHER): Payer: Managed Care, Other (non HMO) | Admitting: Gastroenterology

## 2016-06-29 VITALS — BP 102/58 | HR 64 | Ht 62.5 in | Wt 145.0 lb

## 2016-06-29 DIAGNOSIS — R195 Other fecal abnormalities: Secondary | ICD-10-CM | POA: Diagnosis not present

## 2016-06-29 DIAGNOSIS — R1012 Left upper quadrant pain: Secondary | ICD-10-CM | POA: Insufficient documentation

## 2016-06-29 DIAGNOSIS — A048 Other specified bacterial intestinal infections: Secondary | ICD-10-CM | POA: Diagnosis not present

## 2016-06-29 MED ORDER — BIS SUBCIT-METRONID-TETRACYC 140-125-125 MG PO CAPS: 3.0000 | ORAL_CAPSULE | Freq: Three times a day (TID) | ORAL | 0 refills | Status: DC

## 2016-06-29 NOTE — Telephone Encounter (Signed)
Received fax from pharmacy that Pylera is not covered by insurance. We do have samples (for 10 day supply)  and I informed patient to come by and pick them up. Patient verbalized understanding and will come by today to pick up samples.

## 2016-06-29 NOTE — Patient Instructions (Addendum)
Your physician has requested that you go to the basement for the following lab work in 10 weeks. Around 09/07/16.   We have sent the following medications to your pharmacy for you to pick up at your convenience: Pylera as directed for 14 days.   Please start a daily fiber supplement.

## 2016-06-29 NOTE — Progress Notes (Addendum)
06/29/2016 Jeanette Jackson [id number] 05/27/1976   HISTORY OF PRESENT ILLNESS:  This is a 40 year old female who was sent here by her PCP, Esperanza RichtersEdward Saguier, PA-C, for positive Hpylori breath test.  Complains of LUQ abdominal pain.  Was treated for Hpylori earlier this year with Prevpak without success (although was only treated with once daily PPI) then was retreated with the same medications again recently (again with only once daily PPI) after another positive breath test.  Now with positive breath test again but only several days after her last treatment.  She says that since the last course of medication her discomfort has improved; it was constant sharp pain but is now off and on.  No nausea, vomiting, weight loss.  Also reports that sometimes she has loose stools a couple of times per day.  Unsure if related to certain foods.  Denies rectal bleeding and no "diarrhea" per se.  CBC, CMP, amylase, lipase, TSH all within normal limits recently.   Past Medical History:  Diagnosis Date  . Allergy   . GERD (gastroesophageal reflux disease)   . History of vertigo    history of intermittent vertigo   Past Surgical History:  Procedure Laterality Date  . APPENDECTOMY    . CESAREAN SECTION     x 2  . NASAL SINUS SURGERY      reports that she has never smoked. She has never used smokeless tobacco. Her alcohol and drug histories are not on file. family history includes Coronary artery disease in her maternal aunt; Diabetes in her maternal grandfather. No Known Allergies    Outpatient Encounter Prescriptions as of 06/29/2016  Medication Sig  . bismuth-metronidazole-tetracycline (PYLERA) 140-125-125 MG capsule Take 3 capsules by mouth 4 (four) times daily -  before meals and at bedtime.  Marland Kitchen. omeprazole (PRILOSEC) 40 MG capsule Take 1 capsule (40 mg total) by mouth daily.  . [DISCONTINUED] ibuprofen (ADVIL,MOTRIN) 200 MG tablet Take 200 mg by mouth every 6 (six) hours as needed for moderate  pain. Patient states she takes 3  200mg  tabs as needed  . [DISCONTINUED] traMADol (ULTRAM) 50 MG tablet Take 1 tablet (50 mg total) by mouth every 6 (six) hours as needed. (Patient not taking: Reported on 06/15/2016)   No facility-administered encounter medications on file as of 06/29/2016.      REVIEW OF SYSTEMS  : All other systems reviewed and negative except where noted in the History of Present Illness.   PHYSICAL EXAM: BP (!) 102/58   Pulse 64   Ht 5' 2.5" (1.588 m)   Wt 145 lb (65.8 kg)   BMI 26.10 kg/m  General: Well developed white female in no acute distress Head: Normocephalic and atraumatic Eyes:  Sclerae anicteric, conjunctiva pink. Ears: Normal auditory acuity Lungs: Clear throughout to auscultation Heart: Regular rate and rhythm Abdomen: Soft, non-distended. Normal bowel sounds.  Non-tender. Musculoskeletal: Symmetrical with no gross deformities  Skin: No lesions on visible extremities Extremities: No edema  Neurological: Alert oriented x 4, grossly non-focal Psychological:  Alert and cooperative. Normal mood and affect  ASSESSMENT AND PLAN: -LUQ abdominal pain and positive Hpylori breath test:  Was treated with Prevpak earlier this year without success (although was only treated with once daily PPI) then was retreated with the same medications again recently (again with only once daily PPI).  Now with positive breath test again but only several days after last treatment.  Unclear if this has been another unsuccessful treatment and that she needs to  be treated with a different regimen or if she was rechecked too early.  Given the fact that she failed treatment the first time and was treated with the exact same regimen this time as well, it may be unsuccessful. We will try a course of Helidac (flagyl, bismuth, and tetracycline with BID PPI) x 10 days.  Then we'll plan to check a stool antigen in 8 weeks following treatment.  If symptoms continue despite this then she may  need EGD. -Intermittent loose stools:  Consider starting a daily powder fiber supplement to help bulk the stools.  CC:  Saguier, Ramon Dredge, PA-C  Addendum: Reviewed and agree with initial management. Beverley Fiedler, MD

## 2016-06-29 NOTE — Telephone Encounter (Signed)
Patient informed and verbalized understanding

## 2016-06-29 NOTE — Telephone Encounter (Signed)
That is fine.  Thank you.  Please also inform her that in order to do the stool antigen study in 10 weeks she needs to be off of her PPI for 2 weeks.  I just do not remember if I told her that while at her visit today.  Thank you,  Jess

## 2016-07-08 ENCOUNTER — Telehealth: Payer: Self-pay | Admitting: Gastroenterology

## 2016-07-08 NOTE — Telephone Encounter (Signed)
The pt developed dark stools after starting pylera.  I advised that the pylera has bismuth and can cause dark stools.  She knows to call back if the stools do not return to normal after stopping the pylera.  No other symptoms noted.

## 2016-09-07 ENCOUNTER — Other Ambulatory Visit: Payer: Managed Care, Other (non HMO)

## 2016-09-07 DIAGNOSIS — R1012 Left upper quadrant pain: Secondary | ICD-10-CM

## 2016-09-07 DIAGNOSIS — A048 Other specified bacterial intestinal infections: Secondary | ICD-10-CM

## 2016-09-08 LAB — HELICOBACTER PYLORI  SPECIAL ANTIGEN: H. PYLORI ANTIGEN STOOL: NOT DETECTED

## 2016-09-14 ENCOUNTER — Other Ambulatory Visit: Payer: Self-pay | Admitting: Medical

## 2017-06-14 ENCOUNTER — Ambulatory Visit (INDEPENDENT_AMBULATORY_CARE_PROVIDER_SITE_OTHER): Payer: Managed Care, Other (non HMO) | Admitting: Medical

## 2017-06-14 ENCOUNTER — Ambulatory Visit (HOSPITAL_BASED_OUTPATIENT_CLINIC_OR_DEPARTMENT_OTHER)
Admission: RE | Admit: 2017-06-14 | Discharge: 2017-06-14 | Disposition: A | Discharge status: Home / Self Care | Payer: Managed Care, Other (non HMO) | Source: Ambulatory Visit | Attending: Medical | Admitting: Medical

## 2017-06-14 ENCOUNTER — Telehealth: Payer: Self-pay | Admitting: Medical

## 2017-06-14 ENCOUNTER — Encounter: Payer: Self-pay | Admitting: Medical

## 2017-06-14 VITALS — BP 103/68 | HR 77 | Temp 98.2°F | Resp 16 | Wt 146.6 lb

## 2017-06-14 DIAGNOSIS — M25561 Pain in right knee: Secondary | ICD-10-CM

## 2017-06-14 DIAGNOSIS — Z0001 Encounter for general adult medical examination with abnormal findings: Secondary | ICD-10-CM | POA: Diagnosis not present

## 2017-06-14 DIAGNOSIS — Z1283 Encounter for screening for malignant neoplasm of skin: Secondary | ICD-10-CM

## 2017-06-14 DIAGNOSIS — Z23 Encounter for immunization: Secondary | ICD-10-CM

## 2017-06-14 DIAGNOSIS — Z0000 Encounter for general adult medical examination without abnormal findings: Secondary | ICD-10-CM

## 2017-06-14 DIAGNOSIS — Z1239 Encounter for other screening for malignant neoplasm of breast: Secondary | ICD-10-CM

## 2017-06-14 DIAGNOSIS — G8929 Other chronic pain: Secondary | ICD-10-CM

## 2017-06-14 LAB — COMPREHENSIVE METABOLIC PANEL
ALK PHOS: 50 U/L (ref 39–117)
ALT: 12 U/L (ref 0–35)
AST: 17 U/L (ref 0–37)
Albumin: 4.8 g/dL (ref 3.5–5.2)
BILIRUBIN TOTAL: 0.4 mg/dL (ref 0.2–1.2)
BUN: 10 mg/dL (ref 6–23)
CALCIUM: 9.9 mg/dL (ref 8.4–10.5)
CO2: 30 meq/L (ref 19–32)
Chloride: 100 mEq/L (ref 96–112)
Creatinine, Ser: 0.58 mg/dL (ref 0.40–1.20)
GFR: 122.13 mL/min (ref >60.00)
Glucose, Bld: 98 mg/dL (ref 70–99)
Potassium: 4.5 mEq/L (ref 3.5–5.1)
Sodium: 136 mEq/L (ref 135–145)
Total Protein: 7.5 g/dL (ref 6.0–8.3)

## 2017-06-14 LAB — LIPID PANEL
CHOL/HDL RATIO: 3
CHOLESTEROL: 190 mg/dL (ref 0–200)
HDL: 64.5 mg/dL (ref >39.00)
LDL Cholesterol: 103 mg/dL — ABNORMAL HIGH (ref 0–99)
NonHDL: 125.64
TRIGLYCERIDES: 111 mg/dL (ref 0.0–149.0)
VLDL: 22.2 mg/dL (ref 0.0–40.0)

## 2017-06-14 LAB — CBC
HCT: 38.7 % (ref 36.0–46.0)
HEMOGLOBIN: 12.6 g/dL (ref 12.0–15.0)
MCHC: 32.6 g/dL (ref 30.0–36.0)
MCV: 91.3 fl (ref 78.0–100.0)
PLATELETS: 244 10*3/uL (ref 150.0–400.0)
RBC: 4.24 Mil/uL (ref 3.87–5.11)
RDW: 14.3 % (ref 11.5–15.5)
WBC: 6.2 10*3/uL (ref 4.0–10.5)

## 2017-06-14 LAB — POC URINALSYSI DIPSTICK (AUTOMATED)
BILIRUBIN UA: NEGATIVE
Blood, UA: NEGATIVE
GLUCOSE UA: NEGATIVE
Ketones, UA: NEGATIVE
LEUKOCYTES UA: NEGATIVE
NITRITE UA: NEGATIVE
Protein, UA: NEGATIVE
Spec Grav, UA: 1.025 (ref 1.010–1.025)
Urobilinogen, UA: NEGATIVE E.U./dL — AB
pH, UA: 6 (ref 5.0–8.0)

## 2017-06-14 MED ORDER — AZELASTINE HCL 0.1 % NA SOLN: 2.0000 | Freq: Two times a day (BID) | NASAL | 3 refills | Status: DC

## 2017-06-14 MED ORDER — LEVOCETIRIZINE DIHYDROCHLORIDE 5 MG PO TABS: 5.0000 mg | ORAL_TABLET | Freq: Every evening | ORAL | 2 refills | Status: DC

## 2017-06-14 NOTE — Patient Instructions (Addendum)
For you wellness exam today I have ordered cbc, cmp, lipid panel, and UA.  Vaccine given today. Flu vaccine.  Recommend exercise and healthy diet.  We will let you know lab results as they come in.  For recent allergy symptoms I prescribed Xyzal and Astelin. You can continue the Flonase.  For your history of right knee pain, x-ray of knee today. Be cautious with exercises at gym. Recommend no squatting or leg press. If you do cardio exercises do elliptical. Avoid treadmill. Can use low-dose ibuprofen for pain. His x-ray negative and pain persists can refer to sports medicine for evaluation of meniscus.  Mammogram order placed. Can schedule that today.  Follow up date appointment will be determined after lab review.    Preventive Care 40-64 Years, Female Preventive care refers to lifestyle choices and visits with your health care provider that can promote health and wellness. What does preventive care include?  A yearly physical exam. This is also called an annual well check.  Dental exams once or twice a year.  Routine eye exams. Ask your health care provider how often you should have your eyes checked.  Personal lifestyle choices, including: ? Daily care of your teeth and gums. ? Regular physical activity. ? Eating a healthy diet. ? Avoiding tobacco and drug use. ? Limiting alcohol use. ? Practicing safe sex. ? Taking low-dose aspirin daily starting at age 46. ? Taking vitamin and mineral supplements as recommended by your health care provider. What happens during an annual well check? The services and screenings done by your health care provider during your annual well check will depend on your age, overall health, lifestyle risk factors, and family history of disease. Counseling Your health care provider may ask you questions about your:  Alcohol use.  Tobacco use.  Drug use.  Emotional well-being.  Home and relationship well-being.  Sexual activity.  Eating  habits.  Work and work Statistician.  Method of birth control.  Menstrual cycle.  Pregnancy history.  Screening You may have the following tests or measurements:  Height, weight, and BMI.  Blood pressure.  Lipid and cholesterol levels. These may be checked every 5 years, or more frequently if you are over 64 years old.  Skin check.  Lung cancer screening. You may have this screening every year starting at age 79 if you have a 30-pack-year history of smoking and currently smoke or have quit within the past 15 years.  Fecal occult blood test (FOBT) of the stool. You may have this test every year starting at age 41.  Flexible sigmoidoscopy or colonoscopy. You may have a sigmoidoscopy every 5 years or a colonoscopy every 10 years starting at age 17.  Hepatitis C blood test.  Hepatitis B blood test.  Sexually transmitted disease (STD) testing.  Diabetes screening. This is done by checking your blood sugar (glucose) after you have not eaten for a while (fasting). You may have this done every 1-3 years.  Mammogram. This may be done every 1-2 years. Talk to your health care provider about when you should start having regular mammograms. This may depend on whether you have a family history of breast cancer.  BRCA-related cancer screening. This may be done if you have a family history of breast, ovarian, tubal, or peritoneal cancers.  Pelvic exam and Pap test. This may be done every 3 years starting at age 58. Starting at age 31, this may be done every 5 years if you have a Pap test in combination  with an HPV test.  Bone density scan. This is done to screen for osteoporosis. You may have this scan if you are at high risk for osteoporosis.  Discuss your test results, treatment options, and if necessary, the need for more tests with your health care provider. Vaccines Your health care provider may recommend certain vaccines, such as:  Influenza vaccine. This is recommended every  year.  Tetanus, diphtheria, and acellular pertussis (Tdap, Td) vaccine. You may need a Td booster every 10 years.  Varicella vaccine. You may need this if you have not been vaccinated.  Zoster vaccine. You may need this after age 50.  Measles, mumps, and rubella (MMR) vaccine. You may need at least one dose of MMR if you were born in 1957 or later. You may also need a second dose.  Pneumococcal 13-valent conjugate (PCV13) vaccine. You may need this if you have certain conditions and were not previously vaccinated.  Pneumococcal polysaccharide (PPSV23) vaccine. You may need one or two doses if you smoke cigarettes or if you have certain conditions.  Meningococcal vaccine. You may need this if you have certain conditions.  Hepatitis A vaccine. You may need this if you have certain conditions or if you travel or work in places where you may be exposed to hepatitis A.  Hepatitis B vaccine. You may need this if you have certain conditions or if you travel or work in places where you may be exposed to hepatitis B.  Haemophilus influenzae type b (Hib) vaccine. You may need this if you have certain conditions.  Talk to your health care provider about which screenings and vaccines you need and how often you need them. This information is not intended to replace advice given to you by your health care provider. Make sure you discuss any questions you have with your health care provider. Document Released: 10/02/2015 Document Revised: 05/25/2016 Document Reviewed: 07/07/2015 Elsevier Interactive Patient Education  2017 [employer].

## 2017-06-14 NOTE — Progress Notes (Signed)
Subjective:    Patient ID: Jeanette Jackson, female    DOB: 1976-06-18, 41 y.o.   MRN: [id number]  HPI  Pt in in for CPE. She is fasting..  Pt will do Flu Vaccine today.Up to date on tdap.  Pt up to date on hiv screen.  Pt just started going to gym 3-4 times a week. Working out about 1 hour when exercises. Pt admits not eating healthy diet( but trying to avoid carbs). Married- has 3 children.  Pt never had a mammogram.  Pt last pap smear last year with her gyn.  Pt does notes some occasional pain in her rt knee. Notes medial aspect pain. Pinching sensation. Has to take ibuprofen. Has on and off for one month. Rare occasioanal pain. No recent injury.  Pt states in past had mensiscus are pain when balle.  lmp- Sept 2, 2018.     Review of Systems  Constitutional: Negative for chills, fatigue and fever.  HENT: Positive for congestion and sneezing. Negative for ear pain, facial swelling, postnasal drip, sinus pain, sinus pressure, tinnitus, trouble swallowing and voice change.        4 days nasal congestion and sneezing.  Respiratory: Negative for cough, choking and shortness of breath.   Cardiovascular: Negative for chest pain and palpitations.  Gastrointestinal: Negative for abdominal pain, constipation, diarrhea, nausea, rectal pain and vomiting.  Genitourinary: Negative for dysuria, flank pain and frequency.  Musculoskeletal: Negative for back pain.       Rt knee pain- faint pain. On exam.  Skin: Negative for rash.  Neurological: Negative for dizziness, seizures, syncope, weakness, numbness and headaches.  Hematological: Negative for adenopathy. Does not bruise/bleed easily.  Psychiatric/Behavioral: Negative for behavioral problems, confusion, self-injury and sleep disturbance. The patient is not nervous/anxious.    Past Medical History:  Diagnosis Date  . Allergy   . GERD (gastroesophageal reflux disease)   . History of vertigo    history of intermittent vertigo       Social History   Social History  . Marital status: Married    Spouse name: N/A  . Number of children: N/A  . Years of education: N/A   Occupational History  . Not on file.   Social History Main Topics  . Smoking status: Never Smoker  . Smokeless tobacco: Never Used  . Alcohol use Not on file  . Drug use: Unknown  . Sexual activity: Not on file   Other Topics Concern  . Not on file   Social History Narrative   Married   Three children.   Speaks Spanish    Never Smoked    Alcohol use-yes   Regular exercise-no   Caffeine use/day:  1-2 beverages daily    Past Surgical History:  Procedure Laterality Date  . APPENDECTOMY    . CESAREAN SECTION     x 2  . NASAL SINUS SURGERY      Family History  Problem Relation Age of Onset  . Coronary artery disease Maternal Aunt   . Diabetes Maternal Grandfather     No Known Allergies  No current outpatient prescriptions on file prior to visit.   No current facility-administered medications on file prior to visit.     BP 103/68   Pulse 77   Temp 98.2 F (36.8 C) (Oral)   Resp 16   Wt 146 lb 9.6 oz (66.5 kg)   LMP 05/26/2017   SpO2 100%   BMI 26.39 kg/m  Objective:   Physical Exam   General Mental Status- Alert. General Appearance- Not in acute distress.   Skin General: Color- Normal Color. Moisture- Normal Moisture. 2 moderate sized moles on her back. One mole looked little discolored.  Neck Carotid Arteries- Normal color. Moisture- Normal Moisture. No carotid bruits. No JVD.  Chest and Lung Exam Auscultation: Breath Sounds:-Normal.  Cardiovascular Auscultation:Rythm- Regular. Murmurs & Other Heart Sounds:Auscultation of the heart reveals- No Murmurs.  Abdomen Inspection:-Inspeection Normal. Palpation/Percussion:Note:No mass. Palpation and Percussion of the abdomen reveal- Non Tender, Non Distended + BS, no rebound or guarding.    Neurologic Cranial Nerve exam:- CN III-XII intact(No  nystagmus), symmetric smile. Strength:- 5/5 equal and symmetric strength both upper and lower extremities.  Rt knee- medial meniscus area faint tender to palpation. No crepitus.(no instability). No swelling.    Assessment & Plan:  For you wellness exam today I have ordered cbc, cmp, lipid panel, and UA.  Vaccine given today. Flu vaccine.  Recommend exercise and healthy diet.  We will let you know lab results as they come in.  For recent allergy symptoms I prescribed Xyzal and Astelin. You can continue the Flonase.  For your history of right knee pain, x-ray of knee today. Be cautious with exercises at gym. Recommend no squatting or leg press. If you do cardio exercises do elliptical. Avoid treadmill. Can use low-dose ibuprofen for pain. His x-ray negative and pain persists can refer to sports medicine for evaluation of meniscus.  Mammogram order placed. Can schedule that today.  Follow up date appointment will be determined after lab review.   Elton Heid, Ramon Dredge, PA-C

## 2017-06-14 NOTE — Telephone Encounter (Signed)
Referral sports medicine placed  

## 2017-06-15 NOTE — Addendum Note (Signed)
Addended by: Gwenevere Abbot on: 06/15/2017 02:10 PM   Modules accepted: Level of Service

## 2017-06-19 ENCOUNTER — Encounter: Payer: Self-pay | Admitting: Medical

## 2017-06-22 ENCOUNTER — Encounter: Payer: Self-pay | Admitting: Medical

## 2017-06-22 ENCOUNTER — Ambulatory Visit (HOSPITAL_BASED_OUTPATIENT_CLINIC_OR_DEPARTMENT_OTHER)
Admission: RE | Admit: 2017-06-22 | Discharge: 2017-06-22 | Disposition: A | Discharge status: Home / Self Care | Payer: Managed Care, Other (non HMO) | Source: Ambulatory Visit | Attending: Medical | Admitting: Medical

## 2017-06-22 DIAGNOSIS — Z1231 Encounter for screening mammogram for malignant neoplasm of breast: Secondary | ICD-10-CM | POA: Diagnosis not present

## 2017-06-22 DIAGNOSIS — Z1239 Encounter for other screening for malignant neoplasm of breast: Secondary | ICD-10-CM

## 2018-02-08 ENCOUNTER — Encounter: Payer: Self-pay | Admitting: Medical

## 2018-02-08 ENCOUNTER — Ambulatory Visit (HOSPITAL_BASED_OUTPATIENT_CLINIC_OR_DEPARTMENT_OTHER)
Admission: RE | Admit: 2018-02-08 | Discharge: 2018-02-08 | Disposition: A | Discharge status: Home / Self Care | Payer: Managed Care, Other (non HMO) | Source: Ambulatory Visit | Attending: Medical | Admitting: Medical

## 2018-02-08 ENCOUNTER — Ambulatory Visit: Payer: Managed Care, Other (non HMO) | Admitting: Medical

## 2018-02-08 VITALS — BP 98/73 | HR 74 | Temp 98.5°F | Resp 16 | Ht 63.0 in | Wt 144.6 lb

## 2018-02-08 DIAGNOSIS — M25552 Pain in left hip: Secondary | ICD-10-CM | POA: Diagnosis not present

## 2018-02-08 DIAGNOSIS — M5442 Lumbago with sciatica, left side: Secondary | ICD-10-CM | POA: Insufficient documentation

## 2018-02-08 MED ORDER — DICLOFENAC SODIUM 75 MG PO TBEC: 75.0000 mg | DELAYED_RELEASE_TABLET | Freq: Two times a day (BID) | ORAL | 0 refills | Status: DC

## 2018-02-08 MED ORDER — CYCLOBENZAPRINE HCL 10 MG PO TABS: 10.0000 mg | ORAL_TABLET | Freq: Every day | ORAL | 0 refills | Status: DC

## 2018-02-08 MED ORDER — KETOROLAC TROMETHAMINE 60 MG/2ML IM SOLN
60.0000 mg | Freq: Once | INTRAMUSCULAR | Status: AC
  Administered 2018-02-08: 60 mg via INTRAMUSCULAR

## 2018-02-08 NOTE — Patient Instructions (Signed)
For your lower back pain with sciatica features and left hip pain, I do want to get lumbar spine x-ray and hip x-rays today.  We gave you Toradol 60 mg IM in the office.  I am prescribing diclofenac today.  He can start that tomorrow afternoon.  Also prescribing Flexeril 10 mg tabs to use at night.  Hopefully as your pain subsides you can try some of the below back stretching exercises.  If your pain worsens or changes let us know.  Red flag signs symptoms reviewed that would indicate need for ED evaluation.  Follow-up in 6 days or as needed.(If you want you could my chart me Wednesday morning.)  If symptoms are persisting such as severe radiating pain down the entire leg into the foot constantly and will consider sports medicine referral and possibly getting MRI.   Back Exercises If you have pain in your back, do these exercises 2-3 times each day or as told by your doctor. When the pain goes away, do the exercises once each day, but repeat the steps more times for each exercise (do more repetitions). If you do not have pain in your back, do these exercises once each day or as told by your doctor. Exercises Single Knee to Chest  Do these steps 3-5 times in a row for each leg: 1. Lie on your back on a firm bed or the floor with your legs stretched out. 2. Bring one knee to your chest. 3. Hold your knee to your chest by grabbing your knee or thigh. 4. Pull on your knee until you feel a gentle stretch in your lower back. 5. Keep doing the stretch for 10-30 seconds. 6. Slowly let go of your leg and straighten it.  Pelvic Tilt  Do these steps 5-10 times in a row: 1. Lie on your back on a firm bed or the floor with your legs stretched out. 2. Bend your knees so they point up to the ceiling. Your feet should be flat on the floor. 3. Tighten your lower belly (abdomen) muscles to press your lower back against the floor. This will make your tailbone point up to the ceiling instead of  pointing down to your feet or the floor. 4. Stay in this position for 5-10 seconds while you gently tighten your muscles and breathe evenly.  Cat-Cow  Do these steps until your lower back bends more easily: 1. Get on your hands and knees on a firm surface. Keep your hands under your shoulders, and keep your knees under your hips. You may put padding under your knees. 2. Let your head hang down, and make your tailbone point down to the floor so your lower back is round like the back of a cat. 3. Stay in this position for 5 seconds. 4. Slowly lift your head and make your tailbone point up to the ceiling so your back hangs low (sags) like the back of a cow. 5. Stay in this position for 5 seconds.  Press-Ups  Do these steps 5-10 times in a row: 1. Lie on your belly (face-down) on the floor. 2. Place your hands near your head, about shoulder-width apart. 3. While you keep your back relaxed and keep your hips on the floor, slowly straighten your arms to raise the top half of your body and lift your shoulders. Do not use your back muscles. To make yourself more comfortable, you may change where you place your hands. 4. Stay in this position for 5 seconds. 5.  Slowly return to lying flat on the floor.  Bridges  Do these steps 10 times in a row: 1. Lie on your back on a firm surface. 2. Bend your knees so they point up to the ceiling. Your feet should be flat on the floor. 3. Tighten your butt muscles and lift your butt off of the floor until your waist is almost as high as your knees. If you do not feel the muscles working in your butt and the back of your thighs, slide your feet 1-2 inches farther away from your butt. 4. Stay in this position for 3-5 seconds. 5. Slowly lower your butt to the floor, and let your butt muscles relax.  If this exercise is too easy, try doing it with your arms crossed over your chest. Belly Crunches  Do these steps 5-10 times in a row: 1. Lie on your back on a  firm bed or the floor with your legs stretched out. 2. Bend your knees so they point up to the ceiling. Your feet should be flat on the floor. 3. Cross your arms over your chest. 4. Tip your chin a little bit toward your chest but do not bend your neck. 5. Tighten your belly muscles and slowly raise your chest just enough to lift your shoulder blades a tiny bit off of the floor. 6. Slowly lower your chest and your head to the floor.  Back Lifts Do these steps 5-10 times in a row: 1. Lie on your belly (face-down) with your arms at your sides, and rest your forehead on the floor. 2. Tighten the muscles in your legs and your butt. 3. Slowly lift your chest off of the floor while you keep your hips on the floor. Keep the back of your head in line with the curve in your back. Look at the floor while you do this. 4. Stay in this position for 3-5 seconds. 5. Slowly lower your chest and your face to the floor.  Contact a doctor if:  Your back pain gets a lot worse when you do an exercise.  Your back pain does not lessen 2 hours after you exercise. If you have any of these problems, stop doing the exercises. Do not do them again unless your doctor says it is okay. Get help right away if:  You have sudden, very bad back pain. If this happens, stop doing the exercises. Do not do them again unless your doctor says it is okay. This information is not intended to replace advice given to you by your health care provider. Make sure you discuss any questions you have with your health care provider. Document Released: 10/08/2010 Document Revised: 02/11/2016 Document Reviewed: 10/30/2014 Elsevier Interactive Patient Education  [employer].

## 2018-02-08 NOTE — Progress Notes (Signed)
Subjective:    Patient ID: Jeanette Jackson, female    DOB: 1976/06/15, 42 y.o.   MRN: [id number]  HPI  Pt in for 10 days of left side back pain. Some pain in left buttox and some left lower hamstring. No trauma or fall. Pain came on gradual. Pt states at time pain worse standing but sometimes even at test.   Pt has been using motrin 400 mg every 6 hours. It helped some. Pt has been working out.  LMP- within past couple weeks.  Pt has not taken any motrin today.    Review of Systems  Constitutional: Negative for chills, fatigue and fever.  Respiratory: Negative for cough, chest tightness, shortness of breath and wheezing.   Cardiovascular: Negative for chest pain and palpitations.  Gastrointestinal: Negative for abdominal pain.  Genitourinary: Negative for dyspareunia, dysuria, flank pain, frequency, hematuria and pelvic pain.  Musculoskeletal: Positive for back pain. Negative for neck pain and neck stiffness.       Hip pain  Neurological: Negative for dizziness, weakness, numbness and headaches.  Hematological: Negative for adenopathy. Does not bruise/bleed easily.  Psychiatric/Behavioral: Negative for behavioral problems, confusion, self-injury and suicidal ideas. The patient is not nervous/anxious.     Past Medical History:  Diagnosis Date  . Allergy   . GERD (gastroesophageal reflux disease)   . History of vertigo    history of intermittent vertigo     Social History   Socioeconomic History  . Marital status: Married    Spouse name: Not on file  . Number of children: Not on file  . Years of education: Not on file  . Highest education level: Not on file  Occupational History  . Not on file  Social Needs  . Financial resource strain: Not on file  . Food insecurity:    Worry: Not on file    Inability: Not on file  . Transportation needs:    Medical: Not on file    Non-medical: Not on file  Tobacco Use  . Smoking status: Never Smoker  . Smokeless tobacco:  Never Used  Substance and Sexual Activity  . Alcohol use: Not on file  . Drug use: Not on file  . Sexual activity: Not on file  Lifestyle  . Physical activity:    Days per week: Not on file    Minutes per session: Not on file  . Stress: Not on file  Relationships  . Social connections:    Talks on phone: Not on file    Gets together: Not on file    Attends religious service: Not on file    Active member of club or organization: Not on file    Attends meetings of clubs or organizations: Not on file    Relationship status: Not on file  . Intimate partner violence:    Fear of current or ex partner: Not on file    Emotionally abused: Not on file    Physically abused: Not on file    Forced sexual activity: Not on file  Other Topics Concern  . Not on file  Social History Narrative   Married   Three children.   Speaks Spanish    Never Smoked    Alcohol use-yes   Regular exercise-no   Caffeine use/day:  1-2 beverages daily    Past Surgical History:  Procedure Laterality Date  . APPENDECTOMY    . CESAREAN SECTION     x 2  . NASAL SINUS SURGERY  Family History  Problem Relation Age of Onset  . Coronary artery disease Maternal Aunt   . Diabetes Maternal Grandfather     No Known Allergies  No current outpatient medications on file prior to visit.   No current facility-administered medications on file prior to visit.     BP 98/73   Pulse 74   Temp 98.5 F (36.9 C) (Oral)   Resp 16   Ht  (1.6 m)   Wt 144 lb 9.6 oz (65.6 kg)   SpO2 100%   BMI 25.61 kg/m       Objective:   Physical Exam  General Appearance- Not in acute distress.    Chest and Lung Exam Auscultation: Breath sounds:-Normal. Clear even and unlabored. Adventitious sounds:- No Adventitious sounds.  Cardiovascular Auscultation:Rythm - Regular, rate and rythm. Heart Sounds -Normal heart sounds.  Abdomen Inspection:-Inspection Normal.  Palpation/Perucssion: Palpation and  Percussion of the abdomen reveal- Non Tender, No Rebound tenderness, No rigidity(Guarding) and No Palpable abdominal masses.  Liver:-Normal.  Spleen:- Normal.   Back Mild mid lumbar spine tenderness to palpation. Lt si moderate tenderness.  Pain on straight leg lift.(some pain radiates to lateral distal thigh Pain on lateral movements and flexion/extension of the spine.  Left hip- mild pain on palpation and range of motion.  Lower ext neurologic  L5-S1 sensation intact bilaterally. Normal patellar reflexes bilaterally. No foot drop bilaterally.      Assessment & Plan:  For your lower back pain with sciatica features and left hip pain, I do want to get lumbar spine x-ray and hip x-rays today.  We gave you Toradol 60 mg IM in the office.  I am prescribing diclofenac today.  He can start that tomorrow afternoon.  Also prescribing Flexeril 10 mg tabs to use at night.  Hopefully as your pain subsides you can try some of the below back stretching exercises.  If your pain worsens or changes let us know.  Red flag signs symptoms reviewed that would indicate need for ED evaluation.  Follow-up in 6 days or as needed.(If you want you could my chart me Wednesday morning.)  If symptoms are persisting such as severe radiating pain down the entire leg into the foot constantly and will consider sports medicine referral and possibly getting MRI.  Esperanza Richters, PA-C

## 2018-06-12 ENCOUNTER — Telehealth: Payer: Self-pay | Admitting: Medical

## 2018-06-12 ENCOUNTER — Encounter: Payer: Self-pay | Admitting: Medical

## 2018-06-12 ENCOUNTER — Ambulatory Visit (INDEPENDENT_AMBULATORY_CARE_PROVIDER_SITE_OTHER): Payer: Managed Care, Other (non HMO) | Admitting: Medical

## 2018-06-12 VITALS — BP 103/70 | HR 68 | Temp 98.2°F | Resp 16 | Ht 63.0 in | Wt 145.0 lb

## 2018-06-12 DIAGNOSIS — Z0000 Encounter for general adult medical examination without abnormal findings: Secondary | ICD-10-CM

## 2018-06-12 DIAGNOSIS — L989 Disorder of the skin and subcutaneous tissue, unspecified: Secondary | ICD-10-CM | POA: Diagnosis not present

## 2018-06-12 DIAGNOSIS — R102 Pelvic and perineal pain: Secondary | ICD-10-CM | POA: Diagnosis not present

## 2018-06-12 DIAGNOSIS — Z23 Encounter for immunization: Secondary | ICD-10-CM

## 2018-06-12 DIAGNOSIS — Z1231 Encounter for screening mammogram for malignant neoplasm of breast: Secondary | ICD-10-CM

## 2018-06-12 DIAGNOSIS — Z1283 Encounter for screening for malignant neoplasm of skin: Secondary | ICD-10-CM | POA: Diagnosis not present

## 2018-06-12 LAB — POC URINALSYSI DIPSTICK (AUTOMATED)
BILIRUBIN UA: NEGATIVE
GLUCOSE UA: NEGATIVE
KETONES UA: NEGATIVE
Leukocytes, UA: NEGATIVE
Nitrite, UA: NEGATIVE
Protein, UA: NEGATIVE
RBC UA: NEGATIVE
SPEC GRAV UA: 1.015 (ref 1.010–1.025)
Urobilinogen, UA: NEGATIVE E.U./dL — AB
pH, UA: 7 (ref 5.0–8.0)

## 2018-06-12 LAB — COMPREHENSIVE METABOLIC PANEL
ALBUMIN: 4.5 g/dL (ref 3.5–5.2)
ALT: 10 U/L (ref 0–35)
AST: 15 U/L (ref 0–37)
Alkaline Phosphatase: 47 U/L (ref 39–117)
BUN: 10 mg/dL (ref 6–23)
CHLORIDE: 103 meq/L (ref 96–112)
CO2: 29 mEq/L (ref 19–32)
Calcium: 9.4 mg/dL (ref 8.4–10.5)
Creatinine, Ser: 0.51 mg/dL (ref 0.40–1.20)
GFR: 140.98 mL/min (ref >60.00)
Glucose, Bld: 84 mg/dL (ref 70–99)
Potassium: 4.5 mEq/L (ref 3.5–5.1)
SODIUM: 137 meq/L (ref 135–145)
TOTAL PROTEIN: 7.1 g/dL (ref 6.0–8.3)
Total Bilirubin: 0.4 mg/dL (ref 0.2–1.2)

## 2018-06-12 LAB — CBC WITH DIFFERENTIAL/PLATELET
BASOS PCT: 0.4 % (ref 0.0–3.0)
Basophils Absolute: 0 10*3/uL (ref 0.0–0.1)
EOS ABS: 0 10*3/uL (ref 0.0–0.7)
EOS PCT: 0.3 % (ref 0.0–5.0)
HEMATOCRIT: 37.1 % (ref 36.0–46.0)
HEMOGLOBIN: 12.3 g/dL (ref 12.0–15.0)
Lymphocytes Relative: 30.7 % (ref 12.0–46.0)
Lymphs Abs: 1.8 10*3/uL (ref 0.7–4.0)
MCHC: 33.2 g/dL (ref 30.0–36.0)
MCV: 88.7 fl (ref 78.0–100.0)
Monocytes Absolute: 0.5 10*3/uL (ref 0.1–1.0)
Monocytes Relative: 8.7 % (ref 3.0–12.0)
Neutro Abs: 3.4 10*3/uL (ref 1.4–7.7)
Neutrophils Relative %: 59.9 % (ref 43.0–77.0)
Platelets: 225 10*3/uL (ref 150.0–400.0)
RBC: 4.18 Mil/uL (ref 3.87–5.11)
RDW: 14.6 % (ref 11.5–15.5)
WBC: 5.7 10*3/uL (ref 4.0–10.5)

## 2018-06-12 LAB — LIPID PANEL
CHOL/HDL RATIO: 3
Cholesterol: 179 mg/dL (ref 0–200)
HDL: 56.5 mg/dL (ref >39.00)
LDL CALC: 103 mg/dL — AB (ref 0–99)
NonHDL: 122.22
Triglycerides: 97 mg/dL (ref 0.0–149.0)
VLDL: 19.4 mg/dL (ref 0.0–40.0)

## 2018-06-12 NOTE — Telephone Encounter (Signed)
[facility]. Pt preference.

## 2018-06-12 NOTE — Progress Notes (Signed)
Subjective:    Patient ID: Jeanette Jackson, female    DOB: 05/26/1976, 42 y.o.   MRN: [id number]  HPI  Pt in for CPE.  Pt still working at same place.   She has been 3 days a week recently. Some weights. No cardio yet. She admits diet not the best. Pt drinks one cup coffee a day.   Pt will get flu vaccine today.  Pt states she had papsmear one year ago. Pt exam was normal last year. She states that they told her time to repeat pap. She does have hx of endometriosis.  Lmp- Sept 4,2019.   Review of Systems  Constitutional: Negative for chills, fatigue and fever.  Respiratory: Negative for cough, chest tightness, shortness of breath and wheezing.   Cardiovascular: Negative for chest pain and palpitations.  Genitourinary: Negative for difficulty urinating, dyspareunia, frequency, urgency and vaginal pain.       Faint suprapubic region pressure  Neurological: Negative for dizziness, speech difficulty, weakness, numbness and headaches.  Hematological: Negative for adenopathy. Does not bruise/bleed easily.   Past Medical History:  Diagnosis Date  . Allergy   . GERD (gastroesophageal reflux disease)   . History of vertigo    history of intermittent vertigo     Social History   Socioeconomic History  . Marital status: Married    Spouse name: Not on file  . Number of children: Not on file  . Years of education: Not on file  . Highest education level: Not on file  Occupational History  . Not on file  Social Needs  . Financial resource strain: Not on file  . Food insecurity:    Worry: Not on file    Inability: Not on file  . Transportation needs:    Medical: Not on file    Non-medical: Not on file  Tobacco Use  . Smoking status: Never Smoker  . Smokeless tobacco: Never Used  Substance and Sexual Activity  . Alcohol use: Not on file  . Drug use: Not on file  . Sexual activity: Not on file  Lifestyle  . Physical activity:    Days per week: Not on file    Minutes  per session: Not on file  . Stress: Not on file  Relationships  . Social connections:    Talks on phone: Not on file    Gets together: Not on file    Attends religious service: Not on file    Active member of club or organization: Not on file    Attends meetings of clubs or organizations: Not on file    Relationship status: Not on file  . Intimate partner violence:    Fear of current or ex partner: Not on file    Emotionally abused: Not on file    Physically abused: Not on file    Forced sexual activity: Not on file  Other Topics Concern  . Not on file  Social History Narrative   Married   Three children.   Speaks Spanish    Never Smoked    Alcohol use-yes   Regular exercise-no   Caffeine use/day:  1-2 beverages daily    Past Surgical History:  Procedure Laterality Date  . APPENDECTOMY    . CESAREAN SECTION     x 2  . NASAL SINUS SURGERY      Family History  Problem Relation Age of Onset  . Coronary artery disease Maternal Aunt   . Diabetes Maternal Grandfather  No Known Allergies  Current Outpatient Medications on File Prior to Visit  Medication Sig Dispense Refill  . amoxicillin-clavulanate (AUGMENTIN) 875-125 MG tablet Take 1 tablet by mouth 2 (two) times daily. for 7 days  1  . ciprofloxacin-dexamethasone (CIPRODEX) OTIC suspension Place 4 drops into the right ear 2 times daily for 7 days.     No current facility-administered medications on file prior to visit.     BP 103/70   Pulse 68   Temp 98.2 F (36.8 C) (Oral)   Resp 16   Ht 5\' 3"  (1.6 m)   Wt 145 lb (65.8 kg)   SpO2 100%   BMI 25.69 kg/m       Objective:   Physical Exam  General Mental Status- Alert. General Appearance- Not in acute distress.   Skin General: Color- Normal Color. Moisture- Normal Moisture. Rt side of cheek. Small area that seems to be increasing in size. Also dark  Mole left shoulder area. Last year mid back moderate dark large mole.  Neck Carotid Arteries-  Normal color. Moisture- Normal Moisture. No carotid bruits. No JVD.  Chest and Lung Exam Auscultation: Breath Sounds:-Normal.  Cardiovascular Auscultation:Rythm- Regular. Murmurs & Other Heart Sounds:Auscultation of the heart reveals- No Murmurs.  Abdomen Inspection:-Inspeection Normal. Palpation/Percussion:Note:No mass. Palpation and Percussion of the abdomen reveal- faint suprapubic regionTender, Non Distended + BS, no rebound or guarding.    Neurologic Cranial Nerve exam:- CN III-XII intact(No nystagmus), symmetric smile. Strength:- 5/5 equal and symmetric strength both upper and lower extremities.      Assessment & Plan:  For you wellness exam today I have ordered cbc, cmp, lipid panel, and ua.  Vaccine given today flu.  Recommend exercise and healthy diet.  We will let you know lab results as they come in.  Did go ahead and send out urine culture today due to recent faint suprapubic region pain.  Also referred to dermatologist.    Follow up date appointment will be determined after lab review.  Esperanza RichtersEdward Dawnelle Warman, PA-C

## 2018-06-12 NOTE — Patient Instructions (Addendum)
For you wellness exam today I have ordered cbc, cmp, lipid panel, and ua.  Vaccine given today flu vaccine  Recommend exercise and healthy diet.  We will let you know lab results as they come in.  Did go ahead and send out urine culture today due to recent faint suprapubic region pain.  Also referred to dermatologist.   Follow up date appointment will be determined after lab review.    + Preventive Care 18-39 Years, Female Preventive care refers to lifestyle choices and visits with your health care provider that can promote health and wellness. What does preventive care include?  A yearly physical exam. This is also called an annual well check.  Dental exams once or twice a year.  Routine eye exams. Ask your health care provider how often you should have your eyes checked.  Personal lifestyle choices, including: ? Daily care of your teeth and gums. ? Regular physical activity. ? Eating a healthy diet. ? Avoiding tobacco and drug use. ? Limiting alcohol use. ? Practicing safe sex. ? Taking vitamin and mineral supplements as recommended by your health care provider. What happens during an annual well check? The services and screenings done by your health care provider during your annual well check will depend on your age, overall health, lifestyle risk factors, and family history of disease. Counseling Your health care provider may ask you questions about your:  Alcohol use.  Tobacco use.  Drug use.  Emotional well-being.  Home and relationship well-being.  Sexual activity.  Eating habits.  Work and work Statistician.  Method of birth control.  Menstrual cycle.  Pregnancy history.  Screening You may have the following tests or measurements:  Height, weight, and BMI.  Diabetes screening. This is done by checking your blood sugar (glucose) after you have not eaten for a while (fasting).  Blood pressure.  Lipid and cholesterol levels. These may be  checked every 5 years starting at age 51.  Skin check.  Hepatitis C blood test.  Hepatitis B blood test.  Sexually transmitted disease (STD) testing.  BRCA-related cancer screening. This may be done if you have a family history of breast, ovarian, tubal, or peritoneal cancers.  Pelvic exam and Pap test. This may be done every 3 years starting at age 83. Starting at age 73, this may be done every 5 years if you have a Pap test in combination with an HPV test.  Discuss your test results, treatment options, and if necessary, the need for more tests with your health care provider. Vaccines Your health care provider may recommend certain vaccines, such as:  Influenza vaccine. This is recommended every year.  Tetanus, diphtheria, and acellular pertussis (Tdap, Td) vaccine. You may need a Td booster every 10 years.  Varicella vaccine. You may need this if you have not been vaccinated.  HPV vaccine. If you are 21 or younger, you may need three doses over 6 months.  Measles, mumps, and rubella (MMR) vaccine. You may need at least one dose of MMR. You may also need a second dose.  Pneumococcal 13-valent conjugate (PCV13) vaccine. You may need this if you have certain conditions and were not previously vaccinated.  Pneumococcal polysaccharide (PPSV23) vaccine. You may need one or two doses if you smoke cigarettes or if you have certain conditions.  Meningococcal vaccine. One dose is recommended if you are age 17-21 years and a first-year college student living in a residence hall, or if you have one of several medical conditions. You  may also need additional booster doses.  Hepatitis A vaccine. You may need this if you have certain conditions or if you travel or work in places where you may be exposed to hepatitis A.  Hepatitis B vaccine. You may need this if you have certain conditions or if you travel or work in places where you may be exposed to hepatitis B.  Haemophilus influenzae type  b (Hib) vaccine. You may need this if you have certain risk factors.  Talk to your health care provider about which screenings and vaccines you need and how often you need them. This information is not intended to replace advice given to you by your health care provider. Make sure you discuss any questions you have with your health care provider. Document Released: 11/01/2001 Document Revised: 05/25/2016 Document Reviewed: 07/07/2015 Elsevier Interactive Patient Education  [employer].

## 2018-06-13 LAB — URINE CULTURE
MICRO NUMBER:: 91145998
RESULT: NO GROWTH
SPECIMEN QUALITY:: ADEQUATE

## 2018-06-30 ENCOUNTER — Ambulatory Visit (HOSPITAL_BASED_OUTPATIENT_CLINIC_OR_DEPARTMENT_OTHER)
Admission: RE | Admit: 2018-06-30 | Discharge: 2018-06-30 | Disposition: A | Discharge status: Home / Self Care | Payer: Managed Care, Other (non HMO) | Source: Ambulatory Visit | Attending: Medical | Admitting: Medical

## 2018-06-30 DIAGNOSIS — Z1231 Encounter for screening mammogram for malignant neoplasm of breast: Secondary | ICD-10-CM | POA: Insufficient documentation

## 2018-07-12 IMAGING — MG 2D DIGITAL SCREENING BILATERAL MAMMOGRAM WITH CAD AND ADJUNCT TO
8 series · 9 of 24 positions shown · non-contrast
Comparison: None.

CLINICAL DATA: Screening.

EXAM:
2D DIGITAL SCREENING BILATERAL MAMMOGRAM WITH CAD AND ADJUNCT TOMO

[R CC]
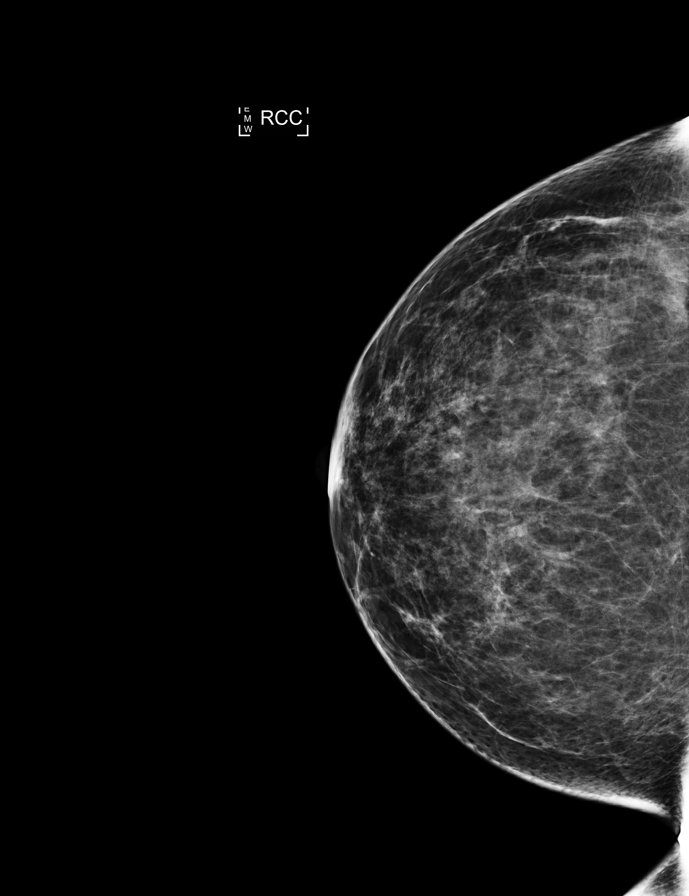

[R MLO]
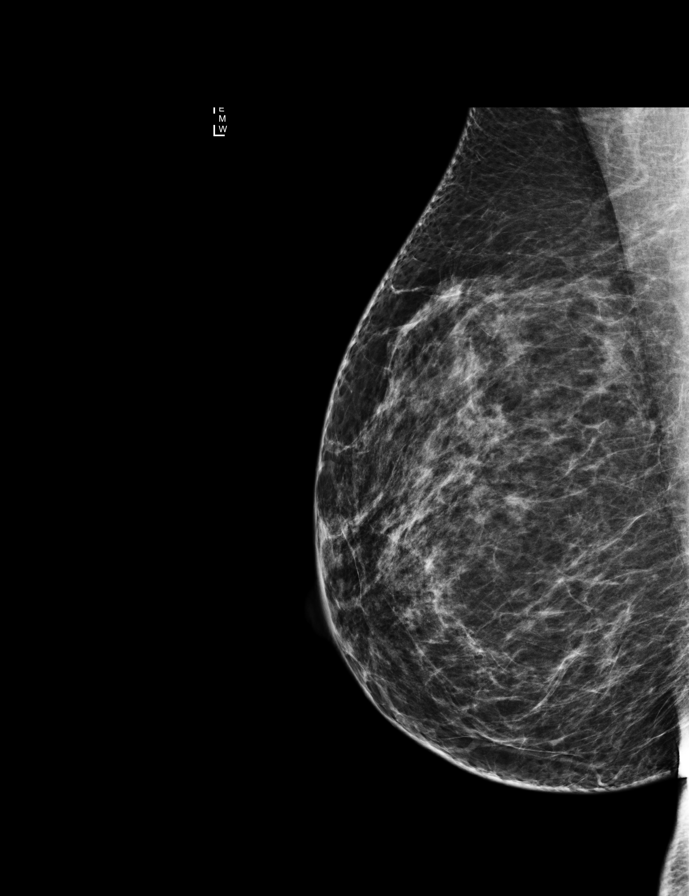

[L CC]
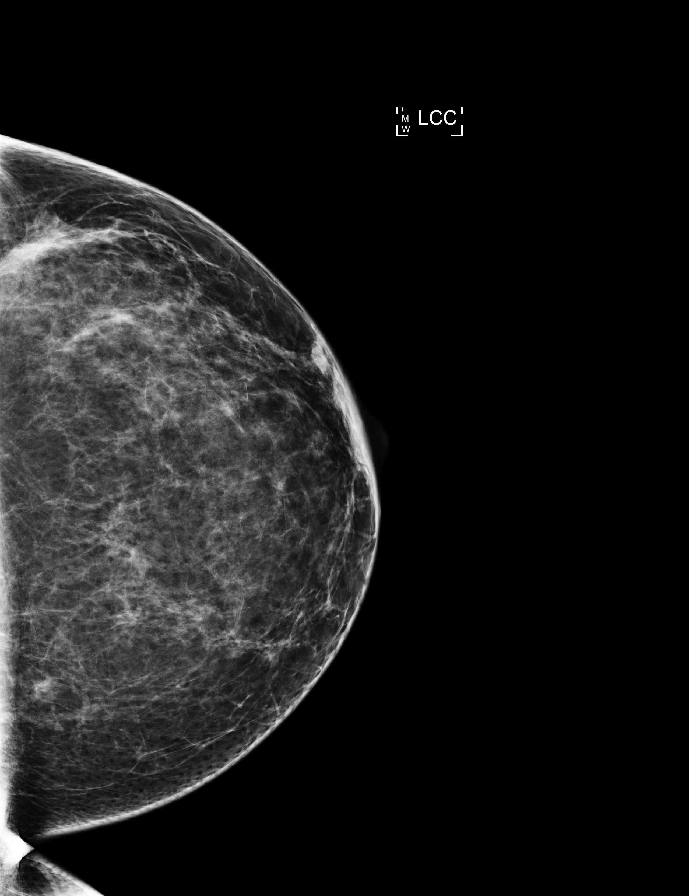

[L MLO]
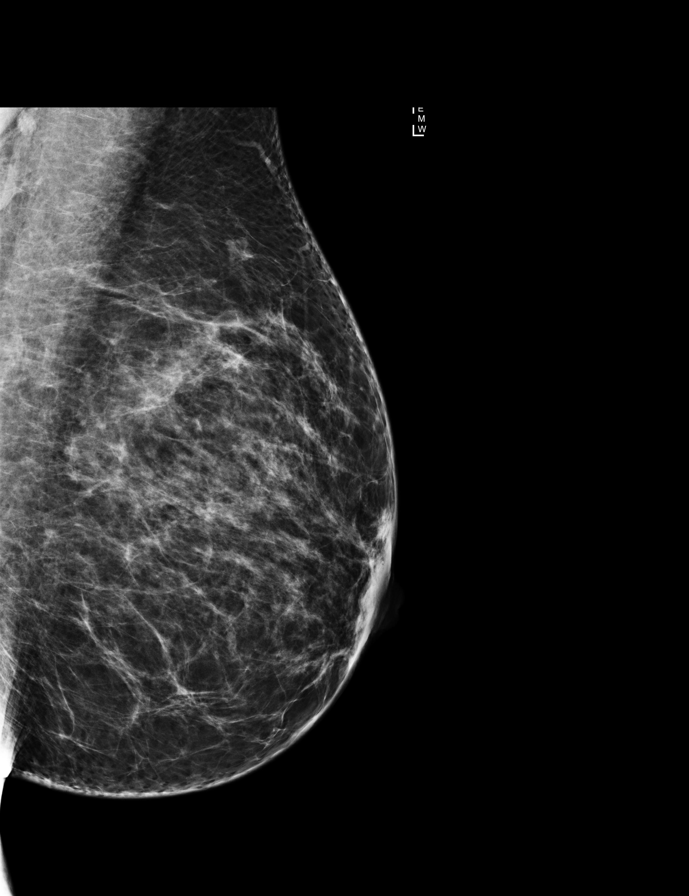

[L CC tomo · 2 of 77 frames shown]
[frame 25/77]
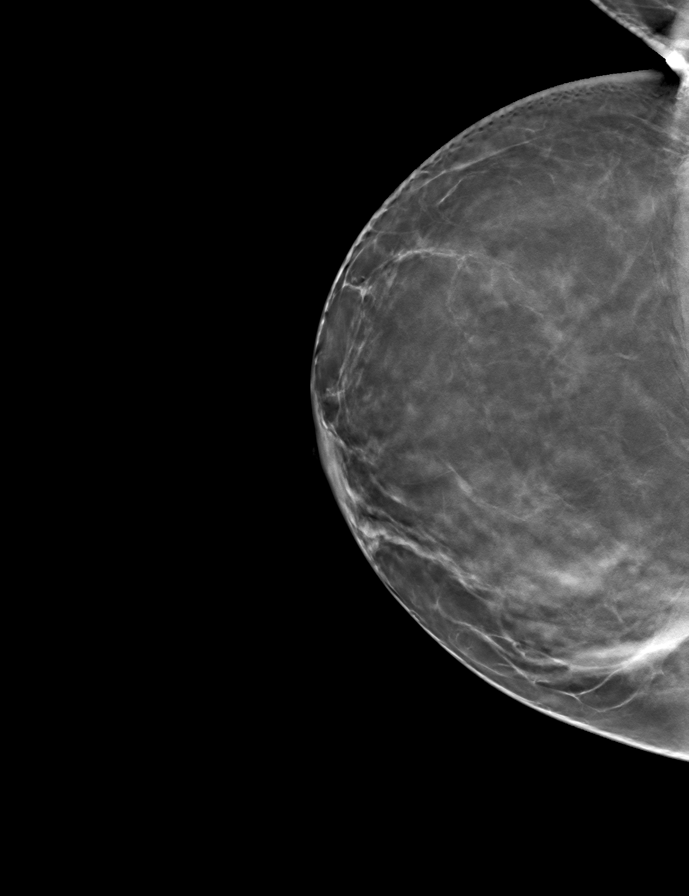
[frame 39/77]
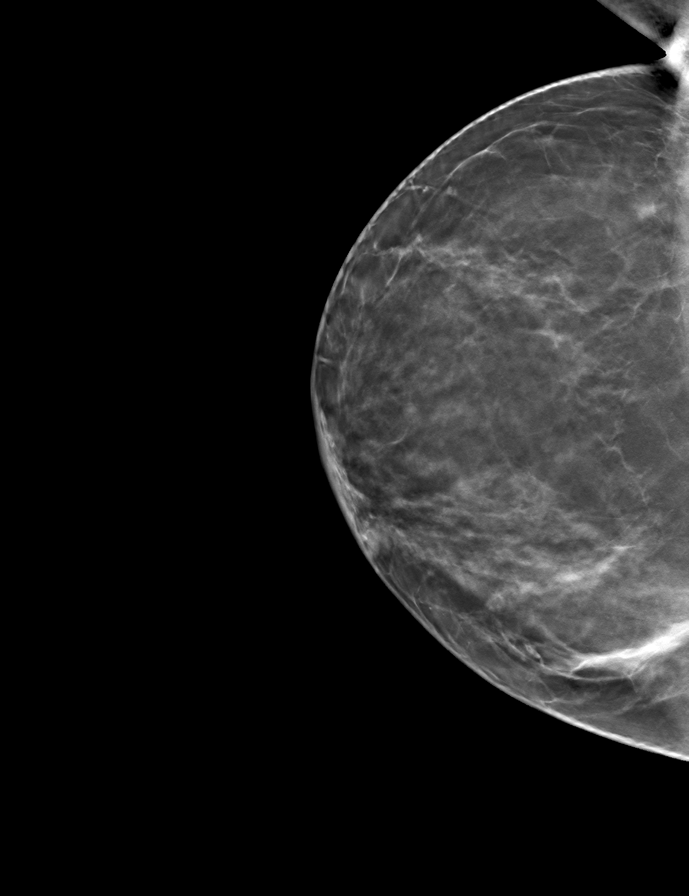

[R CC tomo · tomo slice 42/83.0]
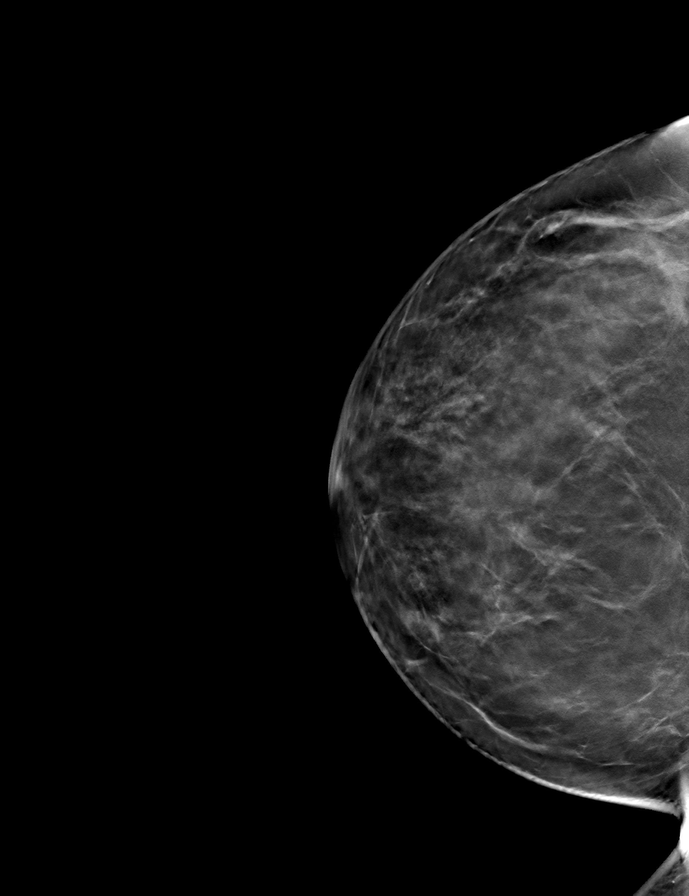

[L MLO tomo · tomo slice 43/84.0]
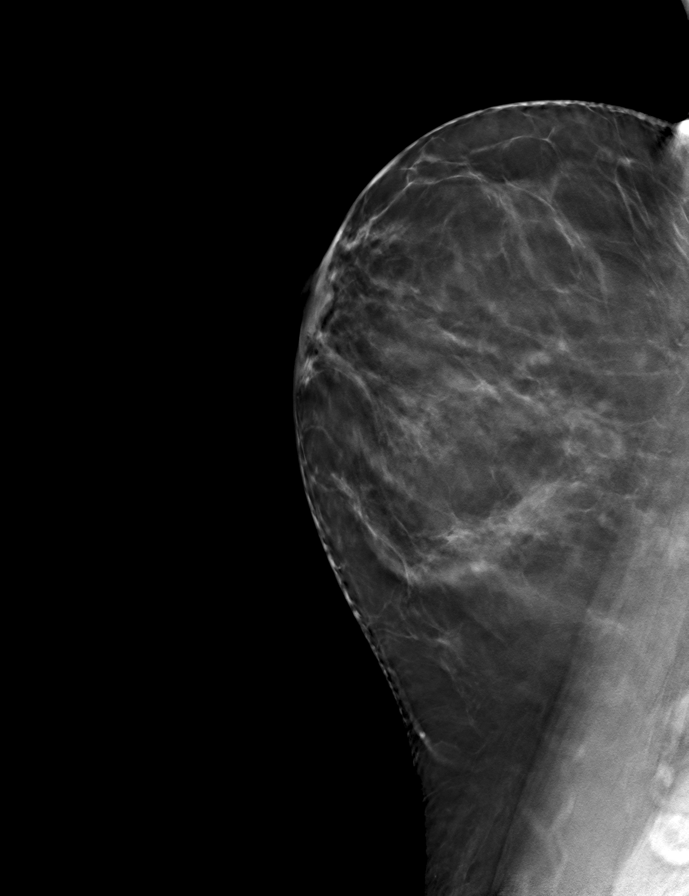

[R MLO tomo · tomo slice 41/80.0]
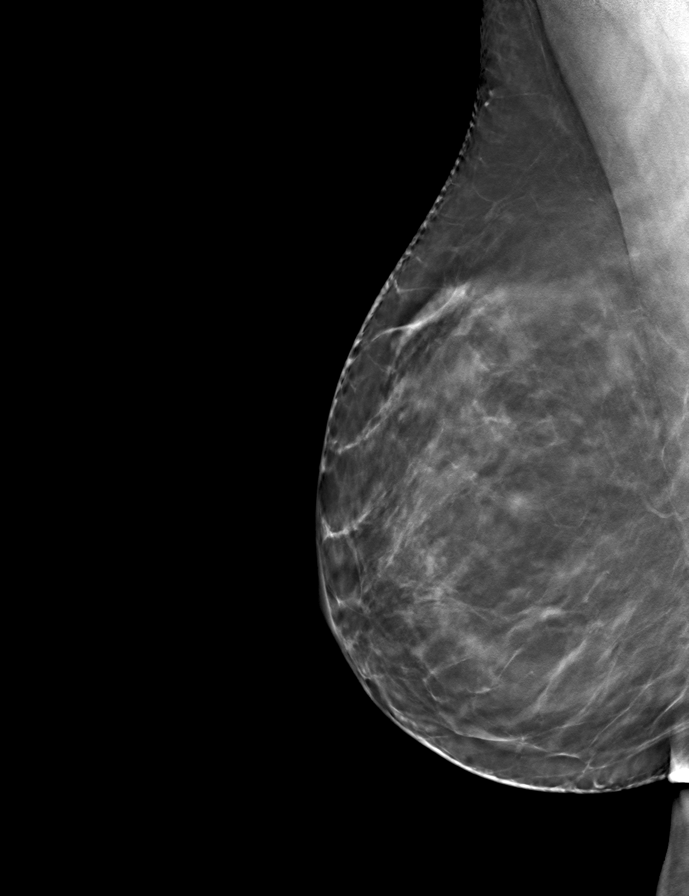

[9 of 24 positions shown; findings below may reference images not displayed]

ACR Breast Density Category b: There are scattered areas of
fibroglandular density.
FINDINGS: There are no findings suspicious for malignancy. Images were
processed with CAD.
IMPRESSION: No mammographic evidence of malignancy. A result letter of this
screening mammogram will be mailed directly to the patient.

RECOMMENDATION:
Screening mammogram in one year. (Code:[id number])

BI-RADS CATEGORY  1: Negative.

## 2018-07-18 ENCOUNTER — Telehealth: Payer: Self-pay | Admitting: *Deleted

## 2018-07-18 NOTE — Telephone Encounter (Signed)
Received Annual Routine Physical Exam Certification Form, completed as much as possible; forwarded to provider/SLS 10/30 

## 2018-07-24 ENCOUNTER — Telehealth: Payer: Self-pay | Admitting: Medical

## 2018-07-24 NOTE — Telephone Encounter (Signed)
Please help remind me to finish up her paper work on physical exam.

## 2018-07-24 NOTE — Telephone Encounter (Signed)
Pt. Calling to check on status of ppwk. Routed to PCP to follow up.

## 2018-07-25 ENCOUNTER — Telehealth: Payer: Self-pay | Admitting: Medical

## 2018-07-25 NOTE — Telephone Encounter (Signed)
Did sign off on her form as well as others. Placed on your desk. Thanks for reminder.

## 2018-08-14 ENCOUNTER — Ambulatory Visit: Payer: Managed Care, Other (non HMO) | Admitting: Medical

## 2019-08-29 ENCOUNTER — Encounter: Payer: Managed Care, Other (non HMO) | Admitting: Medical

## 2019-09-02 ENCOUNTER — Other Ambulatory Visit: Payer: Self-pay

## 2019-09-03 ENCOUNTER — Ambulatory Visit (INDEPENDENT_AMBULATORY_CARE_PROVIDER_SITE_OTHER): Payer: Managed Care, Other (non HMO) | Admitting: Medical

## 2019-09-03 ENCOUNTER — Other Ambulatory Visit: Payer: Self-pay

## 2019-09-03 ENCOUNTER — Encounter: Payer: Self-pay | Admitting: Medical

## 2019-09-03 VITALS — BP 97/66 | HR 73 | Temp 96.0°F | Resp 12 | Ht 63.0 in | Wt 143.0 lb

## 2019-09-03 DIAGNOSIS — Z1283 Encounter for screening for malignant neoplasm of skin: Secondary | ICD-10-CM

## 2019-09-03 DIAGNOSIS — Z0001 Encounter for general adult medical examination with abnormal findings: Secondary | ICD-10-CM

## 2019-09-03 DIAGNOSIS — Z0000 Encounter for general adult medical examination without abnormal findings: Secondary | ICD-10-CM

## 2019-09-03 DIAGNOSIS — L989 Disorder of the skin and subcutaneous tissue, unspecified: Secondary | ICD-10-CM | POA: Diagnosis not present

## 2019-09-03 DIAGNOSIS — K130 Diseases of lips: Secondary | ICD-10-CM

## 2019-09-03 LAB — CBC WITH DIFFERENTIAL/PLATELET
Basophils Absolute: 0 10*3/uL (ref 0.0–0.1)
Basophils Relative: 0.6 % (ref 0.0–3.0)
Eosinophils Absolute: 0 10*3/uL (ref 0.0–0.7)
Eosinophils Relative: 0.4 % (ref 0.0–5.0)
HCT: 35.7 % — ABNORMAL LOW (ref 36.0–46.0)
Hemoglobin: 11.8 g/dL — ABNORMAL LOW (ref 12.0–15.0)
Lymphocytes Relative: 35.6 % (ref 12.0–46.0)
Lymphs Abs: 1.8 10*3/uL (ref 0.7–4.0)
MCHC: 33.1 g/dL (ref 30.0–36.0)
MCV: 88.9 fl (ref 78.0–100.0)
Monocytes Absolute: 0.4 10*3/uL (ref 0.1–1.0)
Monocytes Relative: 7.7 % (ref 3.0–12.0)
Neutro Abs: 2.8 10*3/uL (ref 1.4–7.7)
Neutrophils Relative %: 55.7 % (ref 43.0–77.0)
Platelets: 246 10*3/uL (ref 150.0–400.0)
RBC: 4.02 Mil/uL (ref 3.87–5.11)
RDW: 13.7 % (ref 11.5–15.5)
WBC: 5.1 10*3/uL (ref 4.0–10.5)

## 2019-09-03 LAB — COMPREHENSIVE METABOLIC PANEL
ALT: 8 U/L (ref 0–35)
AST: 14 U/L (ref 0–37)
Albumin: 4.6 g/dL (ref 3.5–5.2)
Alkaline Phosphatase: 57 U/L (ref 39–117)
BUN: 8 mg/dL (ref 6–23)
CO2: 31 mEq/L (ref 19–32)
Calcium: 9.5 mg/dL (ref 8.4–10.5)
Chloride: 100 mEq/L (ref 96–112)
Creatinine, Ser: 0.56 mg/dL (ref 0.40–1.20)
GFR: 118.37 mL/min (ref >60.00)
Glucose, Bld: 93 mg/dL (ref 70–99)
Potassium: 4.1 mEq/L (ref 3.5–5.1)
Sodium: 137 mEq/L (ref 135–145)
Total Bilirubin: 0.4 mg/dL (ref 0.2–1.2)
Total Protein: 6.8 g/dL (ref 6.0–8.3)

## 2019-09-03 LAB — LIPID PANEL
Cholesterol: 193 mg/dL (ref 0–200)
HDL: 53.4 mg/dL (ref >39.00)
LDL Cholesterol: 125 mg/dL — ABNORMAL HIGH (ref 0–99)
NonHDL: 139.18
Total CHOL/HDL Ratio: 4
Triglycerides: 73 mg/dL (ref 0.0–149.0)
VLDL: 14.6 mg/dL (ref 0.0–40.0)

## 2019-09-03 NOTE — Progress Notes (Signed)
Subjective:    Patient ID: Jeanette Jackson, female    DOB: [birth year], 43 y.o.   MRN: [id number]  HPI Pt in for CPE.  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 It is stressful.   She has been exercising and dieting.(she states has been trying to lose some weight that she gained at beginning of pandemic. No cardio yet. She admits diet not the best. Pt drinks one cup coffee a day.   Pt will get flu vaccine today.  Pt had papsmear and mammogram end of October. Both were negative. She does have hx of endometriosis.  Lmp- Sept 4,2019.    Review of Systems  Constitutional: Negative for chills, fatigue and fever.  HENT: Negative for congestion, ear discharge, ear pain, nosebleeds, postnasal drip and sinus pressure.   Respiratory: Negative for chest tightness, shortness of breath and wheezing.   Cardiovascular: Negative for chest pain and palpitations.  Gastrointestinal: Negative for abdominal pain, blood in stool, nausea and vomiting.  Genitourinary: Negative for difficulty urinating, dysuria, frequency, menstrual problem and urgency.  Musculoskeletal: Negative for back pain and gait problem.  Skin: Negative for rash.       Pt has upper lip. Dry lip. She thought maybe exzema of lip. She got med over the counter. She googled and thought maybe precancerous lesion.   She got some med over the counter and it helped. Pt states used eucerin and it did help. But only for few days.  She feels like has sandpaper feeling to her upper lip.   Neurological: Negative for dizziness, speech difficulty, weakness, numbness and headaches.  Hematological: Negative for adenopathy. Does not bruise/bleed easily.  Psychiatric/Behavioral: Negative for behavioral problems, confusion and suicidal ideas. The patient is not nervous/anxious.     Past Medical History:  Diagnosis Date  . Allergy   . GERD (gastroesophageal reflux disease)   . History of vertigo      history of intermittent vertigo     Social History   Socioeconomic History  . Marital status: Married    Spouse name: Not on file  . Number of children: Not on file  . Years of education: Not on file  . Highest education level: Not on file  Occupational History  . Not on file  Tobacco Use  . Smoking status: Never Smoker  . Smokeless tobacco: Never Used  Substance and Sexual Activity  . Alcohol use: Yes    Comment: occasional  . Drug use: Never  . Sexual activity: Not on file  Other Topics Concern  . Not on file  Social History Narrative   Married   Three children.   Speaks Spanish    Never Smoked    Alcohol use-yes   Regular exercise-no   Caffeine use/day:  1-2 beverages daily   Social Determinants of Health   Financial Resource Strain:   . Difficulty of Paying Living Expenses: Not on file  Food Insecurity:   . Worried About Charity fundraiser in the Last Year: Not on file  . Ran Out of Food in the Last Year: Not on file  Transportation Needs:   . Lack of Transportation (Medical): Not on file  . Lack of Transportation (Non-Medical): Not on file  Physical Activity:   . Days of Exercise per Week: Not on file  . Minutes of Exercise per Session: Not on file  Stress:   . Feeling of Stress : Not on file  Social Connections:   .  Frequency of Communication with Friends and Family: Not on file  . Frequency of Social Gatherings with Friends and Family: Not on file  . Attends Religious Services: Not on file  . Active Member of Clubs or Organizations: Not on file  . Attends Banker Meetings: Not on file  . Marital Status: Not on file  Intimate Partner Violence:   . Fear of Current or Ex-Partner: Not on file  . Emotionally Abused: Not on file  . Physically Abused: Not on file  . Sexually Abused: Not on file    Past Surgical History:  Procedure Laterality Date  . APPENDECTOMY    . CESAREAN SECTION     x 2  . NASAL SINUS SURGERY      Family  History  Problem Relation Age of Onset  . Coronary artery disease Maternal Aunt   . Diabetes Maternal Grandfather     No Known Allergies  Current Outpatient Medications on File Prior to Visit  Medication Sig Dispense Refill  . Ascorbic Acid (VITAMIN C PO) Take 1 tablet by mouth daily.    . Multiple Vitamin (MULTIVITAMIN) tablet Take 1 tablet by mouth daily.     No current facility-administered medications on file prior to visit.    BP 97/66 (BP Location: Right Arm, Cuff Size: Normal)   Pulse 73   Temp (!) 96 F (35.6 C) (Temporal)   Resp 12   Ht 5\' 3"  (1.6 m)   Wt 143 lb (64.9 kg) Comment: with shoes  LMP 08/28/2019   SpO2 100%   BMI 25.33 kg/m       Objective:   Physical Exam  General Mental Status- Alert. General Appearance- Not in acute distress.   Skin General: Color- Normal Color. Moisture- Normal Moisture.  Mouth- on inspection no obvious visible lesion. I felt with glove and no abnoramality. When pt feel with her thumb states sandpaper like.   Neck Carotid Arteries- Normal color. Moisture- Normal Moisture. No carotid bruits. No JVD.  Chest and Lung Exam Auscultation: Breath Sounds:-Normal.  Cardiovascular Auscultation:Rythm- Regular. Murmurs & Other Heart Sounds:Auscultation of the heart reveals- No Murmurs.  Abdomen Inspection:-Inspeection Normal. Palpation/Percussion:Note:No mass. Palpation and Percussion of the abdomen reveal- Non Tender, Non Distended + BS, no rebound or guarding.    Neurologic Cranial Nerve exam:- CN III-XII intact(No nystagmus), symmetric smile. Strength:- 5/5 equal and symmetric strength both upper and lower extremities.  Derm- 2 moderate sized moles. Dark appearance on back. One has color variation has dark edge.    Assessment & Plan:  For you wellness exam today I have ordered cbc, cmp and  lipid panel.  Flu vaccine already done this year in October.  Recommend continue to exercise and healthy diet.  We will let  you know lab results as they come in.  Follow up date appointment will be determined after lab review.  10 minute charge as discussed skin lesion/mole on back and upper lip concern. Refer to derm placed.  November, PA-C

## 2019-09-03 NOTE — Patient Instructions (Addendum)
For you wellness exam today I have ordered cbc, cmp and  lipid panel.  Flu vaccine already done this year in October.  Recommend continue to exercise and healthy diet.  We will let you know lab results as they come in.  Follow up date appointment will be determined after lab review.    Refer to derm for moles on back. If no call by end of 1st week of January notify us.  For dry lips and sandpaper feel try evolution of smooth(eos) chapstick. If abnormal feeling persist then get opinion from dermatologist.  Discussed depression questioneer. Moderate high score. Covid type related. Offered medication if needed when you get back from vacation in [place].   Preventive Care 17-60 Years Old, Female Preventive care refers to visits with your health care provider and lifestyle choices that can promote health and wellness. This includes:  A yearly physical exam. This may also be called an annual well check.  Regular dental visits and eye exams.  Immunizations.  Screening for certain conditions.  Healthy lifestyle choices, such as eating a healthy diet, getting regular exercise, not using drugs or products that contain nicotine and tobacco, and limiting alcohol use. What can I expect for my preventive care visit? Physical exam Your health care provider will check your:  Height and weight. This may be used to calculate body mass index (BMI), which tells if you are at a healthy weight.  Heart rate and blood pressure.  Skin for abnormal spots. Counseling Your health care provider may ask you questions about your:  Alcohol, tobacco, and drug use.  Emotional well-being.  Home and relationship well-being.  Sexual activity.  Eating habits.  Work and work Statistician.  Method of birth control.  Menstrual cycle.  Pregnancy history. What immunizations do I need?  Influenza (flu) vaccine  This is recommended every year. Tetanus, diphtheria, and pertussis (Tdap) vaccine  You  may need a Td booster every 10 years. Varicella (chickenpox) vaccine  You may need this if you have not been vaccinated. Zoster (shingles) vaccine  You may need this after age 37. Measles, mumps, and rubella (MMR) vaccine  You may need at least one dose of MMR if you were born in 1957 or later. You may also need a second dose. Pneumococcal conjugate (PCV13) vaccine  You may need this if you have certain conditions and were not previously vaccinated. Pneumococcal polysaccharide (PPSV23) vaccine  You may need one or two doses if you smoke cigarettes or if you have certain conditions. Meningococcal conjugate (MenACWY) vaccine  You may need this if you have certain conditions. Hepatitis A vaccine  You may need this if you have certain conditions or if you travel or work in places where you may be exposed to hepatitis A. Hepatitis B vaccine  You may need this if you have certain conditions or if you travel or work in places where you may be exposed to hepatitis B. Haemophilus influenzae type b (Hib) vaccine  You may need this if you have certain conditions. Human papillomavirus (HPV) vaccine  If recommended by your health care provider, you may need three doses over 6 months. You may receive vaccines as individual doses or as more than one vaccine together in one shot (combination vaccines). Talk with your health care provider about the risks and benefits of combination vaccines. What tests do I need? Blood tests  Lipid and cholesterol levels. These may be checked every 5 years, or more frequently if you are over 50 years  old.  Hepatitis C test.  Hepatitis B test. Screening  Lung cancer screening. You may have this screening every year starting at age 57 if you have a 30-pack-year history of smoking and currently smoke or have quit within the past 15 years.  Colorectal cancer screening. All adults should have this screening starting at age 28 and continuing until age 49. Your  health care provider may recommend screening at age 40 if you are at increased risk. You will have tests every 1-10 years, depending on your results and the type of screening test.  Diabetes screening. This is done by checking your blood sugar (glucose) after you have not eaten for a while (fasting). You may have this done every 1-3 years.  Mammogram. This may be done every 1-2 years. Talk with your health care provider about when you should start having regular mammograms. This may depend on whether you have a family history of breast cancer.  BRCA-related cancer screening. This may be done if you have a family history of breast, ovarian, tubal, or peritoneal cancers.  Pelvic exam and Pap test. This may be done every 3 years starting at age 74. Starting at age 20, this may be done every 5 years if you have a Pap test in combination with an HPV test. Other tests  Sexually transmitted disease (STD) testing.  Bone density scan. This is done to screen for osteoporosis. You may have this scan if you are at high risk for osteoporosis. Follow these instructions at home: Eating and drinking  Eat a diet that includes fresh fruits and vegetables, whole grains, lean protein, and low-fat dairy.  Take vitamin and mineral supplements as recommended by your health care provider.  Do not drink alcohol if: ? Your health care provider tells you not to drink. ? You are pregnant, may be pregnant, or are planning to become pregnant.  If you drink alcohol: ? Limit how much you have to 0-1 drink a day. ? Be aware of how much alcohol is in your drink. In the [place] one drink equals one 12 oz bottle of beer (355 mL), one 5 oz glass of wine (148 mL), or one 1 oz glass of hard liquor (44 mL). Lifestyle  Take daily care of your teeth and gums.  Stay active. Exercise for at least 30 minutes on 5 or more days each week.  Do not use any products that contain nicotine or tobacco, such as cigarettes, e-cigarettes,  and chewing tobacco. If you need help quitting, ask your health care provider.  If you are sexually active, practice safe sex. Use a condom or other form of birth control (contraception) in order to prevent pregnancy and STIs (sexually transmitted infections).  If told by your health care provider, take low-dose aspirin daily starting at age 46. What's next?  Visit your health care provider once a year for a well check visit.  Ask your health care provider how often you should have your eyes and teeth checked.  Stay up to date on all vaccines. This information is not intended to replace advice given to you by your health care provider. Make sure you discuss any questions you have with your health care provider. Document Released: 10/02/2015 Document Revised: 05/17/2018 Document Reviewed: 05/17/2018 Elsevier Patient Education  2020 [employer].

## 2020-05-18 ENCOUNTER — Emergency Department (HOSPITAL_BASED_OUTPATIENT_CLINIC_OR_DEPARTMENT_OTHER): Payer: Managed Care, Other (non HMO)

## 2020-05-18 ENCOUNTER — Encounter (HOSPITAL_BASED_OUTPATIENT_CLINIC_OR_DEPARTMENT_OTHER): Payer: Self-pay | Admitting: Emergency Medicine

## 2020-05-18 ENCOUNTER — Emergency Department (HOSPITAL_BASED_OUTPATIENT_CLINIC_OR_DEPARTMENT_OTHER)
Admission: EM | Admit: 2020-05-18 | Discharge: 2020-05-18 | Disposition: A | Discharge status: Home / Self Care | Payer: Managed Care, Other (non HMO) | Attending: Emergency Medicine | Admitting: Emergency Medicine

## 2020-05-18 ENCOUNTER — Other Ambulatory Visit: Payer: Self-pay

## 2020-05-18 DIAGNOSIS — Z20822 Contact with and (suspected) exposure to covid-19: Secondary | ICD-10-CM | POA: Diagnosis not present

## 2020-05-18 DIAGNOSIS — M546 Pain in thoracic spine: Secondary | ICD-10-CM | POA: Diagnosis not present

## 2020-05-18 DIAGNOSIS — R0789 Other chest pain: Secondary | ICD-10-CM | POA: Diagnosis not present

## 2020-05-18 DIAGNOSIS — R05 Cough: Secondary | ICD-10-CM | POA: Insufficient documentation

## 2020-05-18 DIAGNOSIS — M549 Dorsalgia, unspecified: Secondary | ICD-10-CM | POA: Diagnosis present

## 2020-05-18 LAB — COMPREHENSIVE METABOLIC PANEL
ALT: 12 U/L (ref 0–44)
AST: 16 U/L (ref 15–41)
Albumin: 4 g/dL (ref 3.5–5.0)
Alkaline Phosphatase: 49 U/L (ref 38–126)
Anion gap: 8 (ref 5–15)
BUN: 8 mg/dL (ref 6–20)
CO2: 27 mmol/L (ref 22–32)
Calcium: 9.5 mg/dL (ref 8.9–10.3)
Chloride: 104 mmol/L (ref 98–111)
Creatinine, Ser: 0.6 mg/dL (ref 0.44–1.00)
GFR calc Af Amer: >60 mL/min (ref >60)
GFR calc non Af Amer: >60 mL/min (ref >60)
Glucose, Bld: 162 mg/dL — ABNORMAL HIGH (ref 70–99)
Potassium: 3.6 mmol/L (ref 3.5–5.1)
Sodium: 139 mmol/L (ref 135–145)
Total Bilirubin: 0.4 mg/dL (ref 0.3–1.2)
Total Protein: 6.6 g/dL (ref 6.5–8.1)

## 2020-05-18 LAB — CBC WITH DIFFERENTIAL/PLATELET
Abs Immature Granulocytes: 0.05 10*3/uL (ref 0.00–0.07)
Basophils Absolute: 0 10*3/uL (ref 0.0–0.1)
Basophils Relative: 0 %
Eosinophils Absolute: 0 10*3/uL (ref 0.0–0.5)
Eosinophils Relative: 0 %
HCT: 35.5 % — ABNORMAL LOW (ref 36.0–46.0)
Hemoglobin: 11.4 g/dL — ABNORMAL LOW (ref 12.0–15.0)
Immature Granulocytes: 0 %
Lymphocytes Relative: 17 %
Lymphs Abs: 2 10*3/uL (ref 0.7–4.0)
MCH: 29 pg (ref 26.0–34.0)
MCHC: 32.1 g/dL (ref 30.0–36.0)
MCV: 90.3 fL (ref 80.0–100.0)
Monocytes Absolute: 0.9 10*3/uL (ref 0.1–1.0)
Monocytes Relative: 8 %
Neutro Abs: 8.7 10*3/uL — ABNORMAL HIGH (ref 1.7–7.7)
Neutrophils Relative %: 75 %
Platelets: 264 10*3/uL (ref 150–400)
RBC: 3.93 MIL/uL (ref 3.87–5.11)
RDW: 13.7 % (ref 11.5–15.5)
WBC: 11.7 10*3/uL — ABNORMAL HIGH (ref 4.0–10.5)
nRBC: 0 % (ref 0.0–0.2)

## 2020-05-18 LAB — HCG, QUANTITATIVE, PREGNANCY: hCG, Beta Chain, Quant, S: <1 m[IU]/mL (ref <5)

## 2020-05-18 LAB — TROPONIN I (HIGH SENSITIVITY)
Troponin I (High Sensitivity): <2 ng/L (ref <18)
Troponin I (High Sensitivity): <2 ng/L (ref <18)

## 2020-05-18 LAB — SARS CORONAVIRUS 2 BY RT PCR (HOSPITAL ORDER, PERFORMED IN ~~LOC~~ HOSPITAL LAB): SARS Coronavirus 2: NEGATIVE

## 2020-05-18 LAB — LIPASE, BLOOD: Lipase: 42 U/L (ref 11–51)

## 2020-05-18 MED ORDER — CYCLOBENZAPRINE HCL 10 MG PO TABS: 10.0000 mg | ORAL_TABLET | Freq: Two times a day (BID) | ORAL | 0 refills | Status: AC | PRN

## 2020-05-18 MED ORDER — CYCLOBENZAPRINE HCL 5 MG PO TABS
5.0000 mg | ORAL_TABLET | Freq: Once | ORAL | Status: AC
  Administered 2020-05-18: 5 mg via ORAL
  Filled 2020-05-18: qty 1

## 2020-05-18 MED ORDER — SODIUM CHLORIDE 0.9 % IV BOLUS
1000.0000 mL | Freq: Once | INTRAVENOUS | Status: AC
  Administered 2020-05-18: 1000 mL via INTRAVENOUS

## 2020-05-18 MED ORDER — IOHEXOL 350 MG/ML SOLN
100.0000 mL | Freq: Once | INTRAVENOUS | Status: AC | PRN
  Administered 2020-05-18: 100 mL via INTRAVENOUS

## 2020-05-18 MED ORDER — ONDANSETRON HCL 4 MG/2ML IJ SOLN
4.0000 mg | Freq: Once | INTRAMUSCULAR | Status: AC
  Administered 2020-05-18: 4 mg via INTRAVENOUS
  Filled 2020-05-18: qty 2

## 2020-05-18 MED ORDER — MORPHINE SULFATE (PF) 4 MG/ML IV SOLN
4.0000 mg | Freq: Once | INTRAVENOUS | Status: AC
  Administered 2020-05-18: 4 mg via INTRAVENOUS
  Filled 2020-05-18: qty 1

## 2020-05-18 MED ORDER — LIDOCAINE 5 % EX PTCH: 1.0000 | MEDICATED_PATCH | CUTANEOUS | 0 refills | Status: AC

## 2020-05-18 NOTE — ED Provider Notes (Signed)
[facility] EMERGENCY DEPARTMENT Provider Note   CSN: [id number] Arrival date & time: 05/18/20  1516     History Chief Complaint  Patient presents with  . Back Pain    mid    Jeanette Jackson is a 44 y.o. female with history significant for vertigo who presents for evaluation of back pain.  Patient states 3 weeks ago she was seen at urgent care for right-sided chest pain which radiated to her back.  Patient states she was given prednisone hydrocodone for "inflammation in my heart muscle which help with the pain.  Patient states yesterday she felt an aching in her left calf while she was out in the guarding.  This pain resolved.  Patient states she woke up this morning and felt sharp, pleuritic patient to her right mid back.  And worse with movement and deep breathing.  No current chest pain.  No recent falls or injuries.  No paresthesias, weakness in extremities.  Patient states when she was in urgent care today they told her she needed to be seen for a CT scan of her lungs to rule out a PE.  She has an ultrasound scheduled for tomorrow.  Received Toradol and steroids at urgent care which help with the pain however pain continues.  Has had a nonproductive cough.  She read the first dose of her Covid vaccine however not the second.  No known sick contacts.  Denies headache, lightheadedness, dizziness, chest pain, shortness of breath, paresthesias, weakness in extremities, abdominal pain, diarrhea, dysuria, unilateral leg swelling, redness or warmth.  No prior history of clotting disorders, PE or DVT.  No recent surgery, mobilization, malignancy.  Denies additional aggravating or alleviating factors.  History obtained from patient and past medical records.  No interpreter is used.  HPI     Past Medical History:  Diagnosis Date  . Allergy   . GERD (gastroesophageal reflux disease)   . History of vertigo    history of intermittent vertigo    Patient Active Problem List    Diagnosis Date Noted  . Positive H. pylori test 06/29/2016  . LUQ abdominal pain 06/29/2016  . Loose stools 06/29/2016  . GERD (gastroesophageal reflux disease) 12/22/2015  . Allergic rhinitis 12/22/2015  . Otitis media 11/01/2012  . Back pain 04/23/2012  . Amenorrhea 04/23/2012  . Annual physical exam 05/31/2011  . 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 IRRITABLE BOWEL SYNDROME 05/10/2010  . AMENORRHEA 02/01/2010  . CARPAL TUNNEL SYNDROME 12/16/2009  . ANEMIA NOS 06/13/2007  . DERMATITIS 06/04/2007  . VERTIGO 04/16/2007  . KNEE SPRAIN 02/14/2007  . LAPAROSCOPY, HX OF 11/18/2006    Past Surgical History:  Procedure Laterality Date  . APPENDECTOMY    . CESAREAN SECTION     x 2  . NASAL SINUS SURGERY       OB History   No obstetric history on file.     Family History  Problem Relation Age of Onset  . Coronary artery disease Maternal Aunt   . Diabetes Maternal Grandfather     Social History   Tobacco Use  . Smoking status: Never Smoker  . Smokeless tobacco: Never Used  Substance Use Topics  . Alcohol use: Yes    Comment: occasional  . Drug use: Never    Home Medications Prior to Admission medications   Medication Sig Start Date End Date Taking? Authorizing Provider  Ascorbic Acid (VITAMIN C PO) Take 1 tablet by mouth daily.    [provider]  cyclobenzaprine (FLEXERIL) 10 MG tablet Take 1 tablet (10 mg total) by mouth 2 (two) times daily as needed for muscle spasms. 05/18/20   Breck Maryland A, PA-C  lidocaine (LIDODERM) 5 % Place 1 patch onto the skin daily. Remove & Discard patch within 12 hours or as directed by MD 05/18/20   Loann Chahal A, PA-C  Multiple Vitamin (MULTIVITAMIN) tablet Take 1 tablet by mouth daily.    [provider]    Allergies    Patient has no known allergies.  Review of Systems   Review of Systems  Constitutional: Negative.   HENT: Negative.   Respiratory: Positive for cough. Negative for apnea,  choking, chest tightness, shortness of breath, wheezing and stridor.   Cardiovascular: Positive for chest pain (2 weeks ago). Negative for palpitations and leg swelling.  Gastrointestinal: Negative.   Genitourinary: Negative.   Musculoskeletal: Positive for back pain (Right thoracic).  Skin: Negative.   Neurological: Negative.   All other systems reviewed and are negative.   Physical Exam Updated Vital Signs BP 114/71   Pulse 83   Temp 98.2 F (36.8 C) (Oral)   Resp 15   Ht  (1.6 m)   Wt 64.9 kg   LMP 05/14/2020   SpO2 100%   BMI 25.33 kg/m   Physical Exam Vitals and nursing note reviewed.  Constitutional:      General: She is not in acute distress.    Appearance: She is well-developed. She is not ill-appearing, toxic-appearing or diaphoretic.  HENT:     Head: Normocephalic and atraumatic.     Jaw: There is normal jaw occlusion.     Nose: Nose normal.     Comments: No rhinorrhea or congestion    Mouth/Throat:     Mouth: Mucous membranes are moist.     Comments: Posterior oropharynx clear  Eyes:     Pupils: Pupils are equal, round, and reactive to light.  Neck:     Vascular: No carotid bruit or JVD.     Trachea: Trachea and phonation normal.     Comments: No neck stiffness or neck rigidity Cardiovascular:     Rate and Rhythm: Normal rate.     Pulses: Normal pulses.          Radial pulses are 2+ on the right side and 2+ on the left side.       Posterior tibial pulses are 2+ on the right side and 2+ on the left side.     Heart sounds: Normal heart sounds.  Pulmonary:     Effort: Pulmonary effort is normal. No respiratory distress.     Breath sounds: Normal breath sounds and air entry.     Comments: Clear to auscultation bilaterally. Dry cough on exam. Speaks in full sentences without difficulty Chest:     Comments: Equal rise and fall to chest.  No crepitus, step-offs, erythema Abdominal:     General: Bowel sounds are normal. There is no distension.      Palpations: Abdomen is soft.     Tenderness: There is no abdominal tenderness. There is no right CVA tenderness, left CVA tenderness, guarding or rebound. Negative signs include Murphy's sign and McBurney's sign.     Hernia: No hernia is present.     Comments: Soft, non tender without rebound or guarding.  No overlying skin changes  Musculoskeletal:     Cervical back: Normal, full passive range of motion without pain, normal range of motion and neck supple.  Thoracic back: Tenderness present. No swelling, edema, deformity, signs of trauma, lacerations or spasms. Decreased range of motion. No scoliosis.     Lumbar back: Normal.       Back:     Comments: Compartments soft.  Tenderness diffusely to right posterior thoracic region without crepitus, step-offs.  Full range of motion to bilateral upper and lower extremities without difficulty.  No cervical motion tenderness  Skin:    General: Skin is warm and dry.     Capillary Refill: Capillary refill takes less than 2 seconds.     Comments: Brisk capillary refill  Neurological:     General: No focal deficit present.     Mental Status: She is alert.     Cranial Nerves: Cranial nerves are intact.     Sensory: Sensation is intact.     Motor: Motor function is intact.     Coordination: Coordination is intact.     Gait: Gait is intact.    ED Results / Procedures / Treatments   Labs (all labs ordered are listed, but only abnormal results are displayed) Labs Reviewed  CBC WITH DIFFERENTIAL/PLATELET - Abnormal; Notable for the following components:      Result Value   WBC 11.7 (*)    Hemoglobin 11.4 (*)    HCT 35.5 (*)    Neutro Abs 8.7 (*)    All other components within normal limits  COMPREHENSIVE METABOLIC PANEL - Abnormal; Notable for the following components:   Glucose, Bld 162 (*)    All other components within normal limits  SARS CORONAVIRUS 2 BY RT PCR (HOSPITAL ORDER, PERFORMED IN [facility])  LIPASE, BLOOD  HCG,  QUANTITATIVE, PREGNANCY  TROPONIN I (HIGH SENSITIVITY)  TROPONIN I (HIGH SENSITIVITY)    EKG None  Radiology CT Angio Chest PE W and/or Wo Contrast  Result Date: 05/18/2020 CLINICAL DATA:  Chest pain and shortness of breath EXAM: CT ANGIOGRAPHY CHEST WITH CONTRAST TECHNIQUE: Multidetector CT imaging of the chest was performed using the standard protocol during bolus administration of intravenous contrast. Multiplanar CT image reconstructions and MIPs were obtained to evaluate the vascular anatomy. CONTRAST:  OMNIPAQUE IOHEXOL 350 MG/ML SOLN COMPARISON:  None. FINDINGS: Cardiovascular: There is a optimal opacification of the pulmonary arteries. There is no central,segmental, or subsegmental filling defects within the pulmonary arteries. There is mild cardiomegaly. No pericardial effusion or thickening. No evidence right heart strain. There is normal three-vessel brachiocephalic anatomy without proximal stenosis. The thoracic aorta is normal in appearance. Mediastinum/Nodes: No hilar, mediastinal, or axillary adenopathy. Thyroid gland, trachea, and esophagus demonstrate no significant findings. Lungs/Pleura: The lungs are clear. No pleural effusion or pneumothorax. No airspace consolidation. Upper Abdomen: No acute abnormalities present in the visualized portions of the upper abdomen. Musculoskeletal: No chest wall abnormality. No acute or significant osseous findings. Review of the MIP images confirms the above findings. IMPRESSION: No central, segmental, or subsegmental pulmonary embolism No acute intrathoracic pathology to explain the patient's symptoms. Electronically Signed   By: Jonna Clark M.D.   On: 05/18/2020 21:20    Procedures Procedures (including critical care time)  Medications Ordered in ED Medications  sodium chloride 0.9 % bolus 1,000 mL ( Intravenous Stopped 05/18/20 2142)  ondansetron (ZOFRAN) injection 4 mg (4 mg Intravenous Given 05/18/20 2021)  morphine 4 MG/ML injection  4 mg (4 mg Intravenous Given 05/18/20 2022)  cyclobenzaprine (FLEXERIL) tablet 5 mg (5 mg Oral Given 05/18/20 2023)  iohexol (OMNIPAQUE) 350 MG/ML injection 100 mL (100 mLs  Intravenous Contrast Given 05/18/20 2108)    ED Course  I have reviewed the triage vital signs and the nursing notes.  Pertinent labs & imaging results that were available during my care of the patient were reviewed by me and considered in my medical decision making (see chart for details).  89 old presents for evaluation of right-sided thoracic posterior back pain. Worse with deep breathing and movement.  She did have right-sided chest pain 2 to 3 weeks ago and was seen at urgent care. CP resolved.  Resolved with steroids and Norco.  Did have right calf pain yesterday after working in the garden however no edema, erythema or warmth.  This pain has resolved.  Was seen by urgent care today sent emergency department for CTA chest to rule out PE.  She has ultrasound scheduled tomorrow with urgent care to rule out DVT to her left lower extremity however compartments are soft.  No clinical evidence of DVT to her left lower extremity at this time.  She is amatory without difficulty.  She does have a nonproductive cough and is only received one dose of COVID vaccine.  I am able to reproduce her pain on exam however given prior chest pain, resolved unilateral leg pain yesterday we will obtain CTA.  Patient will keep appointment tomorrow for her ultrasound.  Her abdomen is soft, nontender.  I low suspicion for intra-abdominal pathology, specifically gallbladder pathology as cause of her pain.  Plan on labs, imaging and reassess  Labs, imaging personally reviewed and interpreted: CBC with leukocytosis at 11.7 Covid negative Pregnancy negative Troponin <2 Lipase 42 CMP mild hyperglycemia however no additional electrolyte, renal or liver abnormality EKG without STEMI CTA Chest  Without PE  Patient reassessed.  Pain improved.  Discussed  imaging findings.  Suspect musculoskeletal in etiology given reassuring CTA.  Discussed follow-up with orthopedics.  She was given Norco prescription by urgent care earlier today as well as steroids.  We will add in muscle relaxers as well as lidocaine patches.  I discussed return precautions with patient and husband in room.  Low suspicion for acute tach or pulmonary etiology of symptoms.  Low suspicion for acute neurosurgical emergency.  Have her follow-up outpatient.  The patient has been appropriately medically screened and/or stabilized in the ED. I have low suspicion for any other emergent medical condition which would require further screening, evaluation or treatment in the ED or require inpatient management.  Patient is hemodynamically stable and in no acute distress.  Patient able to ambulate in department prior to ED.  Evaluation does not show acute pathology that would require ongoing or additional emergent interventions while in the emergency department or further inpatient treatment.  I have discussed the diagnosis with the patient and answered all questions.  Pain is been managed while in the emergency department and patient has no further complaints prior to discharge.  Patient is comfortable with plan discussed in room and is stable for discharge at this time.  I have discussed strict return precautions for returning to the emergency department.  Patient was encouraged to follow-up with PCP/specialist refer to at discharge.   MDM Rules/Calculators/A&P                          Jacqlyn KraussMonica E Heinle was evaluated in Emergency Department on 05/18/2020 for the symptoms described in the history of present illness. She was evaluated in the context of the global COVID-19 pandemic, which necessitated consideration that the  patient might be at risk for infection with the SARS-CoV-2 virus that causes COVID-19. Institutional protocols and algorithms that pertain to the evaluation of patients at risk for  COVID-19 are in a state of rapid change based on information released by regulatory bodies including the CDC and federal and state organizations. These policies and algorithms were followed during the patient's care in the ED. Final Clinical Impression(s) / ED Diagnoses Final diagnoses:  Acute right-sided thoracic back pain    Rx / DC Orders ED Discharge Orders         Ordered    cyclobenzaprine (FLEXERIL) 10 MG tablet  2 times daily PRN        05/18/20 2219    lidocaine (LIDODERM) 5 %  Every 24 hours        05/18/20 2219           Cagney Steenson A, PA-C 05/18/20 2225    Tegeler, Canary Brim, MD 05/18/20 2318

## 2020-05-18 NOTE — ED Notes (Signed)
Pt states 3 weeks ago she was seen at [facility] for CP, given prednisone and hydrocodone for "inflammation with my heart muscles". Pain has subsided. Reports yesterday working in the left calf yesterday while working in the garden, denies swelling in the same. Says this morning she was getting dressed for work and started having a pain in her right mid back area that radiates up that has been ongoing. No pain with inspiration. Went to UC prior for the same today.

## 2020-05-18 NOTE — ED Notes (Signed)
Patient transported to CT 

## 2020-05-18 NOTE — ED Triage Notes (Signed)
Pt reports mid back pain , no injury . Painful ROM.

## 2020-05-18 NOTE — Discharge Instructions (Signed)
Take the medications as prescribed.  Follow-up with orthopedics.  Return for new or worsening symptoms.

## 2020-08-26 ENCOUNTER — Ambulatory Visit (INDEPENDENT_AMBULATORY_CARE_PROVIDER_SITE_OTHER): Payer: Managed Care, Other (non HMO) | Admitting: Medical

## 2020-08-26 ENCOUNTER — Other Ambulatory Visit: Payer: Self-pay

## 2020-08-26 VITALS — BP 106/65 | HR 71 | Temp 98.5°F | Resp 18 | Ht 63.0 in | Wt 142.0 lb

## 2020-08-26 DIAGNOSIS — Z0000 Encounter for general adult medical examination without abnormal findings: Secondary | ICD-10-CM

## 2020-08-26 DIAGNOSIS — Z23 Encounter for immunization: Secondary | ICD-10-CM

## 2020-08-26 LAB — LIPID PANEL
Cholesterol: 205 mg/dL — ABNORMAL HIGH (ref 0–200)
HDL: 64.4 mg/dL (ref >39.00)
LDL Cholesterol: 124 mg/dL — ABNORMAL HIGH (ref 0–99)
NonHDL: 140.92
Total CHOL/HDL Ratio: 3
Triglycerides: 84 mg/dL (ref 0.0–149.0)
VLDL: 16.8 mg/dL (ref 0.0–40.0)

## 2020-08-26 LAB — COMPREHENSIVE METABOLIC PANEL
ALT: 7 U/L (ref 0–35)
AST: 15 U/L (ref 0–37)
Albumin: 4.7 g/dL (ref 3.5–5.2)
Alkaline Phosphatase: 58 U/L (ref 39–117)
BUN: 9 mg/dL (ref 6–23)
CO2: 30 mEq/L (ref 19–32)
Calcium: 9.6 mg/dL (ref 8.4–10.5)
Chloride: 102 mEq/L (ref 96–112)
Creatinine, Ser: 0.61 mg/dL (ref 0.40–1.20)
GFR: 109.36 mL/min (ref >60.00)
Glucose, Bld: 90 mg/dL (ref 70–99)
Potassium: 4.8 mEq/L (ref 3.5–5.1)
Sodium: 137 mEq/L (ref 135–145)
Total Bilirubin: 0.4 mg/dL (ref 0.2–1.2)
Total Protein: 7.1 g/dL (ref 6.0–8.3)

## 2020-08-26 LAB — CBC WITH DIFFERENTIAL/PLATELET
Basophils Absolute: 0 10*3/uL (ref 0.0–0.1)
Basophils Relative: 0.6 % (ref 0.0–3.0)
Eosinophils Absolute: 0 10*3/uL (ref 0.0–0.7)
Eosinophils Relative: 0.3 % (ref 0.0–5.0)
HCT: 37 % (ref 36.0–46.0)
Hemoglobin: 11.8 g/dL — ABNORMAL LOW (ref 12.0–15.0)
Lymphocytes Relative: 33.9 % (ref 12.0–46.0)
Lymphs Abs: 1.9 10*3/uL (ref 0.7–4.0)
MCHC: 31.9 g/dL (ref 30.0–36.0)
MCV: 87.4 fl (ref 78.0–100.0)
Monocytes Absolute: 0.5 10*3/uL (ref 0.1–1.0)
Monocytes Relative: 8.8 % (ref 3.0–12.0)
Neutro Abs: 3.2 10*3/uL (ref 1.4–7.7)
Neutrophils Relative %: 56.4 % (ref 43.0–77.0)
Platelets: 254 10*3/uL (ref 150.0–400.0)
RBC: 4.23 Mil/uL (ref 3.87–5.11)
RDW: 14.6 % (ref 11.5–15.5)
WBC: 5.6 10*3/uL (ref 4.0–10.5)

## 2020-08-26 NOTE — Progress Notes (Signed)
Subjective:    Patient ID: Jeanette Jackson, female    DOB: 1976-02-05, 44 y.o.   MRN: [id number]  HPI Pt in for CPE.  Pt working in office now.  She has not been exercising much. . She admits diet not the best. Pt drinks one cup coffee a day.   Pt will get flu vaccine today. Pt has gotten phizer vaccine initial 2 shot series. Has not gotten booster.  Pt not sure when she had her pap. She gets it done thru Dr. Tawanna Cooler office. Pt wants mammogram done here.  Lmp- nov 17,2021.     Review of Systems  Constitutional: Negative for chills and fever.  HENT: Negative for congestion.   Eyes: Negative for photophobia.  Respiratory: Negative for cough, shortness of breath and wheezing.   Cardiovascular: Negative for chest pain and palpitations.  Gastrointestinal: Negative for abdominal distention, abdominal pain, constipation, diarrhea and nausea.  Genitourinary: Negative for difficulty urinating and dysuria.  Musculoskeletal: Negative for back pain and neck pain.       Left hip pain on and off intermittent not today. On and off for 2 months.   Neurological: Negative for dizziness, tremors, seizures and weakness.  Psychiatric/Behavioral: Negative for behavioral problems and dysphoric mood. The patient is not nervous/anxious.     Past Medical History:  Diagnosis Date  . Allergy   . GERD (gastroesophageal reflux disease)   . History of vertigo    history of intermittent vertigo     Social History   Socioeconomic History  . Marital status: Married    Spouse name: Not on file  . Number of children: Not on file  . Years of education: Not on file  . Highest education level: Not on file  Occupational History  . Not on file  Tobacco Use  . Smoking status: Never Smoker  . Smokeless tobacco: Never Used  Substance and Sexual Activity  . Alcohol use: Yes    Comment: occasional  . Drug use: Never  . Sexual activity: Not on file  Other Topics Concern  . Not on file  Social  History Narrative   Married   Three children.   Speaks Spanish    Never Smoked    Alcohol use-yes   Regular exercise-no   Caffeine use/day:  1-2 beverages daily   Social Determinants of Health   Financial Resource Strain:   . Difficulty of Paying Living Expenses: Not on file  Food Insecurity:   . Worried About Programme researcher, broadcasting/film/video in the Last Year: Not on file  . Ran Out of Food in the Last Year: Not on file  Transportation Needs:   . Lack of Transportation (Medical): Not on file  . Lack of Transportation (Non-Medical): Not on file  Physical Activity:   . Days of Exercise per Week: Not on file  . Minutes of Exercise per Session: Not on file  Stress:   . Feeling of Stress : Not on file  Social Connections:   . Frequency of Communication with Friends and Family: Not on file  . Frequency of Social Gatherings with Friends and Family: Not on file  . Attends Religious Services: Not on file  . Active Member of Clubs or Organizations: Not on file  . Attends Banker Meetings: Not on file  . Marital Status: Not on file  Intimate Partner Violence:   . Fear of Current or Ex-Partner: Not on file  . Emotionally Abused: Not on file  . Physically Abused:  Not on file  . Sexually Abused: Not on file    Past Surgical History:  Procedure Laterality Date  . APPENDECTOMY    . CESAREAN SECTION     x 2  . NASAL SINUS SURGERY      Family History  Problem Relation Age of Onset  . Coronary artery disease Maternal Aunt   . Diabetes Maternal Grandfather     No Known Allergies  Current Outpatient Medications on File Prior to Visit  Medication Sig Dispense Refill  . Ascorbic Acid (VITAMIN C PO) Take 1 tablet by mouth daily.    . cyclobenzaprine (FLEXERIL) 10 MG tablet Take 1 tablet (10 mg total) by mouth 2 (two) times daily as needed for muscle spasms. 20 tablet 0  . lidocaine (LIDODERM) 5 % Place 1 patch onto the skin daily. Remove & Discard patch within 12 hours or as  directed by MD 30 patch 0  . Multiple Vitamin (MULTIVITAMIN) tablet Take 1 tablet by mouth daily.     No current facility-administered medications on file prior to visit.    BP 106/65   Pulse 71   Temp 98.5 F (36.9 C) (Oral)   Resp 18   Ht 5\' 3"  (1.6 m)   Wt 142 lb (64.4 kg)   SpO2 99%   BMI 25.15 kg/m      Objective:   Physical Exam   General Mental Status- Alert. General Appearance- Not in acute distress.   Skin General: Color- Normal Color. Moisture- Normal Moisture.  Neck Carotid Arteries- Normal color. Moisture- Normal Moisture. No carotid bruits. No JVD.  Chest and Lung Exam Auscultation: Breath Sounds:-Normal.  Cardiovascular Auscultation:Rythm- Regular. Murmurs & Other Heart Sounds:Auscultation of the heart reveals- No Murmurs.  Abdomen Inspection:-Inspeection Normal. Palpation/Percussion:Note:No mass. Palpation and Percussion of the abdomen reveal- Non Tender, Non Distended + BS, no rebound or guarding.  Neurologic Cranial Nerve exam:- CN III-XII intact(No nystagmus), symmetric smile. Strength:- 5/5 equal and symmetric strength both upper and lower extremities.  Regarding hip pain left side offer xray to do. If pain persists can refer to sport med.     Assessment & Plan:  For you wellness exam today I have ordered cbc, cmp and, lipid panel.  Flu vaccine given today.  Recommend exercise and healthy diet.  We will let you know lab results as they come in.  Follow up date appointment will be determined after lab review.

## 2020-08-26 NOTE — Patient Instructions (Addendum)
For you wellness exam today I have ordered cbc, cmp and, lipid panel.  Flu vaccine given today.  Recommend exercise and healthy diet.  We will let you know lab results as they come in.  Regarding hip pain left side offer xray to do. If pain persists can refer to sport med.  Follow up date appointment will be determined after lab review.       Preventive Care 88-43 Years Old, Female Preventive care refers to visits with your health care provider and lifestyle choices that can promote health and wellness. This includes:  A yearly physical exam. This may also be called an annual well check.  Regular dental visits and eye exams.  Immunizations.  Screening for certain conditions.  Healthy lifestyle choices, such as eating a healthy diet, getting regular exercise, not using drugs or products that contain nicotine and tobacco, and limiting alcohol use. What can I expect for my preventive care visit? Physical exam Your health care provider will check your:  Height and weight. This may be used to calculate body mass index (BMI), which tells if you are at a healthy weight.  Heart rate and blood pressure.  Skin for abnormal spots. Counseling Your health care provider may ask you questions about your:  Alcohol, tobacco, and drug use.  Emotional well-being.  Home and relationship well-being.  Sexual activity.  Eating habits.  Work and work Statistician.  Method of birth control.  Menstrual cycle.  Pregnancy history. What immunizations do I need?  Influenza (flu) vaccine  This is recommended every year. Tetanus, diphtheria, and pertussis (Tdap) vaccine  You may need a Td booster every 10 years. Varicella (chickenpox) vaccine  You may need this if you have not been vaccinated. Zoster (shingles) vaccine  You may need this after age 67. Measles, mumps, and rubella (MMR) vaccine  You may need at least one dose of MMR if you were born in 1957 or later. You may  also need a second dose. Pneumococcal conjugate (PCV13) vaccine  You may need this if you have certain conditions and were not previously vaccinated. Pneumococcal polysaccharide (PPSV23) vaccine  You may need one or two doses if you smoke cigarettes or if you have certain conditions. Meningococcal conjugate (MenACWY) vaccine  You may need this if you have certain conditions. Hepatitis A vaccine  You may need this if you have certain conditions or if you travel or work in places where you may be exposed to hepatitis A. Hepatitis B vaccine  You may need this if you have certain conditions or if you travel or work in places where you may be exposed to hepatitis B. Haemophilus influenzae type b (Hib) vaccine  You may need this if you have certain conditions. Human papillomavirus (HPV) vaccine  If recommended by your health care provider, you may need three doses over 6 months. You may receive vaccines as individual doses or as more than one vaccine together in one shot (combination vaccines). Talk with your health care provider about the risks and benefits of combination vaccines. What tests do I need? Blood tests  Lipid and cholesterol levels. These may be checked every 5 years, or more frequently if you are over 68 years old.  Hepatitis C test.  Hepatitis B test. Screening  Lung cancer screening. You may have this screening every year starting at age 43 if you have a 30-pack-year history of smoking and currently smoke or have quit within the past 15 years.  Colorectal cancer screening. All adults  should have this screening starting at age 43 and continuing until age 72. Your health care provider may recommend screening at age 43 if you are at increased risk. You will have tests every 1-10 years, depending on your results and the type of screening test.  Diabetes screening. This is done by checking your blood sugar (glucose) after you have not eaten for a while (fasting). You may  have this done every 1-3 years.  Mammogram. This may be done every 1-2 years. Talk with your health care provider about when you should start having regular mammograms. This may depend on whether you have a family history of breast cancer.  BRCA-related cancer screening. This may be done if you have a family history of breast, ovarian, tubal, or peritoneal cancers.  Pelvic exam and Pap test. This may be done every 3 years starting at age 43. Starting at age 33, this may be done every 5 years if you have a Pap test in combination with an HPV test. Other tests  Sexually transmitted disease (STD) testing.  Bone density scan. This is done to screen for osteoporosis. You may have this scan if you are at high risk for osteoporosis. Follow these instructions at home: Eating and drinking  Eat a diet that includes fresh fruits and vegetables, whole grains, lean protein, and low-fat dairy.  Take vitamin and mineral supplements as recommended by your health care provider.  Do not drink alcohol if: ? Your health care provider tells you not to drink. ? You are pregnant, may be pregnant, or are planning to become pregnant.  If you drink alcohol: ? Limit how much you have to 0-1 drink a day. ? Be aware of how much alcohol is in your drink. In the [place] one drink equals one 12 oz bottle of beer (355 mL), one 5 oz glass of wine (148 mL), or one 1 oz glass of hard liquor (44 mL). Lifestyle  Take daily care of your teeth and gums.  Stay active. Exercise for at least 30 minutes on 5 or more days each week.  Do not use any products that contain nicotine or tobacco, such as cigarettes, e-cigarettes, and chewing tobacco. If you need help quitting, ask your health care provider.  If you are sexually active, practice safe sex. Use a condom or other form of birth control (contraception) in order to prevent pregnancy and STIs (sexually transmitted infections).  If told by your health care provider, take  low-dose aspirin daily starting at age 45. What's next?  Visit your health care provider once a year for a well check visit.  Ask your health care provider how often you should have your eyes and teeth checked.  Stay up to date on all vaccines. This information is not intended to replace advice given to you by your health care provider. Make sure you discuss any questions you have with your health care provider. Document Revised: 05/17/2018 Document Reviewed: 05/17/2018 Elsevier Patient Education  2020 [employer].

## 2020-08-27 ENCOUNTER — Other Ambulatory Visit (INDEPENDENT_AMBULATORY_CARE_PROVIDER_SITE_OTHER): Payer: Managed Care, Other (non HMO)

## 2020-08-27 DIAGNOSIS — D649 Anemia, unspecified: Secondary | ICD-10-CM

## 2020-08-28 LAB — IBC PANEL
Iron: 53 ug/dL (ref 42–145)
Saturation Ratios: 10.4 % — ABNORMAL LOW (ref 20.0–50.0)
Transferrin: 364 mg/dL — ABNORMAL HIGH (ref 212.0–360.0)

## 2020-08-28 LAB — FERRITIN: Ferritin: 4.6 ng/mL — ABNORMAL LOW (ref 10.0–291.0)

## 2020-08-29 ENCOUNTER — Telehealth: Payer: Self-pay | Admitting: Medical

## 2020-08-29 ENCOUNTER — Encounter: Payer: Self-pay | Admitting: Medical

## 2020-08-29 DIAGNOSIS — D649 Anemia, unspecified: Secondary | ICD-10-CM

## 2020-08-29 MED ORDER — FERROUS SULFATE 324 (65 FE) MG PO TBEC: DELAYED_RELEASE_TABLET | ORAL | 0 refills | Status: AC

## 2020-08-29 NOTE — Telephone Encounter (Signed)
Iron rx sent to pt pharmacy.

## 2020-08-29 NOTE — Telephone Encounter (Signed)
Iron tab sent to pt pharmacy.,

## 2021-01-27 ENCOUNTER — Other Ambulatory Visit: Payer: Self-pay | Admitting: Obstetrics and Gynecology

## 2021-01-27 DIAGNOSIS — N6489 Other specified disorders of breast: Secondary | ICD-10-CM

## 2021-01-27 DIAGNOSIS — R928 Other abnormal and inconclusive findings on diagnostic imaging of breast: Secondary | ICD-10-CM

## 2021-02-25 ENCOUNTER — Other Ambulatory Visit: Payer: Self-pay | Admitting: Obstetrics and Gynecology

## 2021-02-25 DIAGNOSIS — R928 Other abnormal and inconclusive findings on diagnostic imaging of breast: Secondary | ICD-10-CM

## 2021-03-04 ENCOUNTER — Ambulatory Visit
Admission: RE | Admit: 2021-03-04 | Discharge: 2021-03-04 | Disposition: A | Discharge status: Home / Self Care | Payer: Managed Care, Other (non HMO) | Source: Ambulatory Visit | Attending: Obstetrics and Gynecology | Admitting: Obstetrics and Gynecology

## 2021-03-04 ENCOUNTER — Ambulatory Visit
Admission: RE | Admit: 2021-03-04 | Discharge: 2021-03-04 | Disposition: A | Discharge status: Home / Self Care | Payer: 59 | Source: Ambulatory Visit | Attending: Obstetrics and Gynecology | Admitting: Obstetrics and Gynecology

## 2021-03-04 ENCOUNTER — Other Ambulatory Visit: Payer: Self-pay

## 2021-03-04 DIAGNOSIS — R928 Other abnormal and inconclusive findings on diagnostic imaging of breast: Secondary | ICD-10-CM

## 2021-08-02 ENCOUNTER — Other Ambulatory Visit: Payer: Self-pay

## 2021-08-03 ENCOUNTER — Encounter: Payer: Self-pay | Admitting: Medical

## 2021-08-03 ENCOUNTER — Ambulatory Visit (INDEPENDENT_AMBULATORY_CARE_PROVIDER_SITE_OTHER): Payer: 59 | Admitting: Medical

## 2021-08-03 VITALS — BP 105/66 | HR 68 | Temp 98.2°F | Resp 18 | Ht 63.0 in | Wt 146.0 lb

## 2021-08-03 DIAGNOSIS — D649 Anemia, unspecified: Secondary | ICD-10-CM

## 2021-08-03 DIAGNOSIS — Z23 Encounter for immunization: Secondary | ICD-10-CM | POA: Diagnosis not present

## 2021-08-03 DIAGNOSIS — R5383 Other fatigue: Secondary | ICD-10-CM

## 2021-08-03 DIAGNOSIS — Z0000 Encounter for general adult medical examination without abnormal findings: Secondary | ICD-10-CM

## 2021-08-03 DIAGNOSIS — Z111 Encounter for screening for respiratory tuberculosis: Secondary | ICD-10-CM | POA: Diagnosis not present

## 2021-08-03 DIAGNOSIS — R7612 Nonspecific reaction to cell mediated immunity measurement of gamma interferon antigen response without active tuberculosis: Secondary | ICD-10-CM

## 2021-08-03 LAB — CBC WITH DIFFERENTIAL/PLATELET
Basophils Absolute: 0 10*3/uL (ref 0.0–0.1)
Basophils Relative: 0.6 % (ref 0.0–3.0)
Eosinophils Absolute: 0 10*3/uL (ref 0.0–0.7)
Eosinophils Relative: 0.4 % (ref 0.0–5.0)
HCT: 35.4 % — ABNORMAL LOW (ref 36.0–46.0)
Hemoglobin: 11.5 g/dL — ABNORMAL LOW (ref 12.0–15.0)
Lymphocytes Relative: 32.1 % (ref 12.0–46.0)
Lymphs Abs: 1.8 10*3/uL (ref 0.7–4.0)
MCHC: 32.5 g/dL (ref 30.0–36.0)
MCV: 85.8 fl (ref 78.0–100.0)
Monocytes Absolute: 0.4 10*3/uL (ref 0.1–1.0)
Monocytes Relative: 8 % (ref 3.0–12.0)
Neutro Abs: 3.2 10*3/uL (ref 1.4–7.7)
Neutrophils Relative %: 58.9 % (ref 43.0–77.0)
Platelets: 236 10*3/uL (ref 150.0–400.0)
RBC: 4.13 Mil/uL (ref 3.87–5.11)
RDW: 15.5 % (ref 11.5–15.5)
WBC: 5.5 10*3/uL (ref 4.0–10.5)

## 2021-08-03 LAB — COMPREHENSIVE METABOLIC PANEL
ALT: 7 U/L (ref 0–35)
AST: 13 U/L (ref 0–37)
Albumin: 4.6 g/dL (ref 3.5–5.2)
Alkaline Phosphatase: 55 U/L (ref 39–117)
BUN: 11 mg/dL (ref 6–23)
CO2: 30 mEq/L (ref 19–32)
Calcium: 9.4 mg/dL (ref 8.4–10.5)
Chloride: 101 mEq/L (ref 96–112)
Creatinine, Ser: 0.61 mg/dL (ref 0.40–1.20)
GFR: 108.65 mL/min (ref >60.00)
Glucose, Bld: 88 mg/dL (ref 70–99)
Potassium: 4.4 mEq/L (ref 3.5–5.1)
Sodium: 136 mEq/L (ref 135–145)
Total Bilirubin: 0.3 mg/dL (ref 0.2–1.2)
Total Protein: 7 g/dL (ref 6.0–8.3)

## 2021-08-03 LAB — T4, FREE: Free T4: 0.92 ng/dL (ref 0.60–1.60)

## 2021-08-03 LAB — TSH: TSH: 2.85 u[IU]/mL (ref 0.35–5.50)

## 2021-08-03 LAB — LIPID PANEL
Cholesterol: 205 mg/dL — ABNORMAL HIGH (ref 0–200)
HDL: 62.4 mg/dL (ref >39.00)
LDL Cholesterol: 119 mg/dL — ABNORMAL HIGH (ref 0–99)
NonHDL: 142.43
Total CHOL/HDL Ratio: 3
Triglycerides: 117 mg/dL (ref 0.0–149.0)
VLDL: 23.4 mg/dL (ref 0.0–40.0)

## 2021-08-03 LAB — IRON: Iron: 33 ug/dL — ABNORMAL LOW (ref 42–145)

## 2021-08-03 LAB — VITAMIN B12: Vitamin B-12: 312 pg/mL (ref 211–911)

## 2021-08-03 NOTE — Addendum Note (Signed)
Addended by: Gwenevere Abbot on: 08/03/2021 06:53 PM   Modules accepted: Orders

## 2021-08-03 NOTE — Progress Notes (Signed)
Subjective:    Patient ID: Jeanette Jackson, female    DOB: 17-Jun-1976, 45 y.o.   MRN: [id number]  HPI Pt in for CPE.   Pt working in office now.   She has not been exercising much. . She admits diet not the best. Pt drinks one cup coffee a day.    Pt will get flu vaccine today. Pt has gotten phizer vaccine initial 2 shot series. Has not gotten booster.   Pt not sure when she had her pap. She gets it done thru Dr. Tawanna Cooler office January 14, 2021. Pt last mammogram was 03/04/2021 with [place].    Pt states she has been stressed and depressed with leaving job and in process of getting new job. Some marital issues. Pt thinks she will improve/hopeful once starts new job. Describes diffiult transition.   Review of Systems  Constitutional:  Positive for fatigue. Negative for chills and fever.       Seemed more fatigued when depressed.  HENT:  Negative for congestion.   Eyes:  Negative for photophobia.  Respiratory:  Negative for cough, shortness of breath and wheezing.   Cardiovascular:  Negative for chest pain and palpitations.  Gastrointestinal:  Negative for abdominal distention, abdominal pain, constipation, diarrhea and nausea.  Genitourinary:  Negative for difficulty urinating and dysuria.  Musculoskeletal:  Negative for back pain and neck pain.  Neurological:  Negative for dizziness, tremors, seizures and weakness.  Psychiatric/Behavioral:  Negative for behavioral problems and dysphoric mood. The patient is not nervous/anxious.      Past Medical History:  Diagnosis Date   Allergy    GERD (gastroesophageal reflux disease)    History of vertigo    history of intermittent vertigo     Social History   Socioeconomic History   Marital status: Married    Spouse name: Not on file   Number of children: Not on file   Years of education: Not on file   Highest education level: Not on file  Occupational History   Not on file  Tobacco Use   Smoking status: Never   Smokeless tobacco:  Never  Substance and Sexual Activity   Alcohol use: Yes    Comment: occasional   Drug use: Never   Sexual activity: Not on file  Other Topics Concern   Not on file  Social History Narrative   Married   Three children.   Speaks Spanish    Never Smoked    Alcohol use-yes   Regular exercise-no   Caffeine use/day:  1-2 beverages daily   Social Determinants of Health   Financial Resource Strain: Not on file  Food Insecurity: Not on file  Transportation Needs: Not on file  Physical Activity: Not on file  Stress: Not on file  Social Connections: Not on file  Intimate Partner Violence: Not on file    Past Surgical History:  Procedure Laterality Date   APPENDECTOMY     CESAREAN SECTION     x 2   NASAL SINUS SURGERY      Family History  Problem Relation Age of Onset   Coronary artery disease Maternal Aunt    Diabetes Maternal Grandfather     No Known Allergies  Current Outpatient Medications on File Prior to Visit  Medication Sig Dispense Refill   Ascorbic Acid (VITAMIN C PO) Take 1 tablet by mouth daily.     cyclobenzaprine (FLEXERIL) 10 MG tablet Take 1 tablet (10 mg total) by mouth 2 (two) times daily as needed  for muscle spasms. 20 tablet 0   ferrous sulfate 324 (65 Fe) MG TBEC 1 tab po q day 30 tablet 0   lidocaine (LIDODERM) 5 % Place 1 patch onto the skin daily. Remove & Discard patch within 12 hours or as directed by MD 30 patch 0   Multiple Vitamin (MULTIVITAMIN) tablet Take 1 tablet by mouth daily.     No current facility-administered medications on file prior to visit.    BP 105/66   Pulse 68   Temp 98.2 F (36.8 C)   Resp 18   Ht 5\' 3"  (1.6 m)   Wt 146 lb (66.2 kg)   SpO2 100%   BMI 25.86 kg/m        Objective:   Physical Exam  General Mental Status- Alert. General Appearance- Not in acute distress.    Skin General: Color- Normal Color. Moisture- Normal Moisture.   Neck Carotid Arteries- Normal color. Moisture- Normal Moisture. No  carotid bruits. No JVD.   Chest and Lung Exam Auscultation: Breath Sounds:-Normal.   Cardiovascular Auscultation:Rythm- Regular. Murmurs & Other Heart Sounds:Auscultation of the heart reveals- No Murmurs.   Abdomen Inspection:-Inspeection Normal. Palpation/Percussion:Note:No mass. Palpation and Percussion of the abdomen reveal- Non Tender, Non Distended + BS, no rebound or guarding.   Neurologic  Cranial Nerve exam:- CN III-XII intact(No nystagmus), symmetric smile. Strength:- 5/5 equal and symmetric strength both upper and lower extremities.      Assessment & Plan:   For you wellness exam today I have ordered cbc, cmp and lipid panel.   Flu vaccine given today.   Recommend exercise and healthy diet.   We will let you know lab results as they come in.   Follow up date appointment will be determined after lab review.  Some depression and stress recently. As you transition to new job if signs/symptoms don't improve could offer medication and or referral   , PA-C

## 2021-08-03 NOTE — Patient Instructions (Addendum)
For you wellness exam today I have ordered cbc, cmp and lipid panel.   Flu vaccine given today.   Recommend exercise and healthy diet.   We will let you know lab results as they come in.   Follow up date appointment will be determined after lab review.  Some depression and stress recently. As you transition to new job if signs/symptoms don't improve could offer medication and or referral   For fatigue will add on fatigue labs.  Preventive Care 44-55 Years Old, Female Preventive care refers to lifestyle choices and visits with your health care provider that can promote health and wellness. Preventive care visits are also called wellness exams. What can I expect for my preventive care visit? Counseling Your health care provider may ask you questions about your: Medical history, including: Past medical problems. Family medical history. Pregnancy history. Current health, including: Menstrual cycle. Method of birth control. Emotional well-being. Home life and relationship well-being. Sexual activity and sexual health. Lifestyle, including: Alcohol, nicotine or tobacco, and drug use. Access to firearms. Diet, exercise, and sleep habits. Work and work Statistician. Sunscreen use. Safety issues such as seatbelt and bike helmet use. Physical exam Your health care provider will check your: Height and weight. These may be used to calculate your BMI (body mass index). BMI is a measurement that tells if you are at a healthy weight. Waist circumference. This measures the distance around your waistline. This measurement also tells if you are at a healthy weight and may help predict your risk of certain diseases, such as type 2 diabetes and high blood pressure. Heart rate and blood pressure. Body temperature. Skin for abnormal spots. What immunizations do I need? Vaccines are usually given at various ages, according to a schedule. Your health care provider will recommend vaccines for you  based on your age, medical history, and lifestyle or other factors, such as travel or where you work. What tests do I need? Screening Your health care provider may recommend screening tests for certain conditions. This may include: Lipid and cholesterol levels. Diabetes screening. This is done by checking your blood sugar (glucose) after you have not eaten for a while (fasting). Pelvic exam and Pap test. Hepatitis B test. Hepatitis C test. HIV (human immunodeficiency virus) test. STI (sexually transmitted infection) testing, if you are at risk. Lung cancer screening. Colorectal cancer screening. Mammogram. Talk with your health care provider about when you should start having regular mammograms. This may depend on whether you have a family history of breast cancer. BRCA-related cancer screening. This may be done if you have a family history of breast, ovarian, tubal, or peritoneal cancers. Bone density scan. This is done to screen for osteoporosis. Talk with your health care provider about your test results, treatment options, and if necessary, the need for more tests. Follow these instructions at home: Eating and drinking  Eat a diet that includes fresh fruits and vegetables, whole grains, lean protein, and low-fat dairy products. Take vitamin and mineral supplements as recommended by your health care provider. Do not drink alcohol if: Your health care provider tells you not to drink. You are pregnant, may be pregnant, or are planning to become pregnant. If you drink alcohol: Limit how much you have to 0-1 drink a day. Know how much alcohol is in your drink. In the [place] one drink equals one 12 oz bottle of beer (355 mL), one 5 oz glass of wine (148 mL), or one 1 oz glass of hard liquor (44  mL). Lifestyle Brush your teeth every morning and night with fluoride toothpaste. Floss one time each day. Exercise for at least 30 minutes 5 or more days each week. Do not use any products that  contain nicotine or tobacco. These products include cigarettes, chewing tobacco, and vaping devices, such as e-cigarettes. If you need help quitting, ask your health care provider. Do not use drugs. If you are sexually active, practice safe sex. Use a condom or other form of protection to prevent STIs. If you do not wish to become pregnant, use a form of birth control. If you plan to become pregnant, see your health care provider for a prepregnancy visit. Take aspirin only as told by your health care provider. Make sure that you understand how much to take and what form to take. Work with your health care provider to find out whether it is safe and beneficial for you to take aspirin daily. Find healthy ways to manage stress, such as: Meditation, yoga, or listening to music. Journaling. Talking to a trusted person. Spending time with friends and family. Minimize exposure to UV radiation to reduce your risk of skin cancer. Safety Always wear your seat belt while driving or riding in a vehicle. Do not drive: If you have been drinking alcohol. Do not ride with someone who has been drinking. When you are tired or distracted. While texting. If you have been using any mind-altering substances or drugs. Wear a helmet and other protective equipment during sports activities. If you have firearms in your house, make sure you follow all gun safety procedures. Seek help if you have been physically or sexually abused. What's next? Visit your health care provider once a year for an annual wellness visit. Ask your health care provider how often you should have your eyes and teeth checked. Stay up to date on all vaccines. This information is not intended to replace advice given to you by your health care provider. Make sure you discuss any questions you have with your health care provider. Document Revised: 03/03/2021 Document Reviewed: 03/03/2021 Elsevier Patient Education  [place].

## 2021-08-06 ENCOUNTER — Ambulatory Visit (HOSPITAL_BASED_OUTPATIENT_CLINIC_OR_DEPARTMENT_OTHER)
Admission: RE | Admit: 2021-08-06 | Discharge: 2021-08-06 | Disposition: A | Discharge status: Home / Self Care | Payer: 59 | Source: Ambulatory Visit | Attending: Medical | Admitting: Medical

## 2021-08-06 ENCOUNTER — Encounter: Payer: Self-pay | Admitting: Medical

## 2021-08-06 ENCOUNTER — Other Ambulatory Visit (INDEPENDENT_AMBULATORY_CARE_PROVIDER_SITE_OTHER): Payer: 59

## 2021-08-06 ENCOUNTER — Other Ambulatory Visit: Payer: Self-pay

## 2021-08-06 DIAGNOSIS — D649 Anemia, unspecified: Secondary | ICD-10-CM

## 2021-08-06 DIAGNOSIS — R7612 Nonspecific reaction to cell mediated immunity measurement of gamma interferon antigen response without active tuberculosis: Secondary | ICD-10-CM | POA: Diagnosis present

## 2021-08-06 DIAGNOSIS — Z111 Encounter for screening for respiratory tuberculosis: Secondary | ICD-10-CM

## 2021-08-06 LAB — IRON: Iron: 42 ug/dL (ref 42–145)

## 2021-08-06 LAB — CBC WITH DIFFERENTIAL/PLATELET
Basophils Absolute: 0 10*3/uL (ref 0.0–0.1)
Basophils Relative: 0.4 % (ref 0.0–3.0)
Eosinophils Absolute: 0 10*3/uL (ref 0.0–0.7)
Eosinophils Relative: 0.3 % (ref 0.0–5.0)
HCT: 36.9 % (ref 36.0–46.0)
Hemoglobin: 11.9 g/dL — ABNORMAL LOW (ref 12.0–15.0)
Lymphocytes Relative: 33.1 % (ref 12.0–46.0)
Lymphs Abs: 1.8 10*3/uL (ref 0.7–4.0)
MCHC: 32.4 g/dL (ref 30.0–36.0)
MCV: 85.4 fl (ref 78.0–100.0)
Monocytes Absolute: 0.4 10*3/uL (ref 0.1–1.0)
Monocytes Relative: 7.7 % (ref 3.0–12.0)
Neutro Abs: 3.1 10*3/uL (ref 1.4–7.7)
Neutrophils Relative %: 58.5 % (ref 43.0–77.0)
Platelets: 230 10*3/uL (ref 150.0–400.0)
RBC: 4.32 Mil/uL (ref 3.87–5.11)
RDW: 15.7 % — ABNORMAL HIGH (ref 11.5–15.5)
WBC: 5.4 10*3/uL (ref 4.0–10.5)

## 2021-08-06 LAB — QUANTIFERON-TB GOLD PLUS
Mitogen-NIL: >10 IU/mL
NIL: 0.39 IU/mL
QuantiFERON-TB Gold Plus: POSITIVE — AB
TB1-NIL: >10 IU/mL
TB2-NIL: 8.09 IU/mL

## 2021-08-06 NOTE — Addendum Note (Signed)
Addended by: Gwenevere Abbot on: 08/06/2021 08:43 AM   Modules accepted: Orders

## 2021-08-08 LAB — QUANTIFERON-TB GOLD PLUS
Mitogen-NIL: >10 IU/mL
NIL: 0.1 IU/mL
QuantiFERON-TB Gold Plus: POSITIVE — AB
TB1-NIL: >10 IU/mL
TB2-NIL: >10 IU/mL

## 2021-08-09 ENCOUNTER — Encounter: Payer: Self-pay | Admitting: Medical

## 2021-08-09 NOTE — Addendum Note (Signed)
Addended by: Gwenevere Abbot on: 08/09/2021 08:58 AM   Modules accepted: Orders

## 2021-12-14 ENCOUNTER — Telehealth: Payer: Self-pay

## 2021-12-14 NOTE — Telephone Encounter (Signed)
Pt's Spouse and pt called into the office (on speaker phone) to dispute $39.50 statement they received.  They had contacted billing, but was told to call the office for an explanation as to why they received this bill.  I advised both of them the pt was seen for her yearly CPE on 08/03/2021 and fatigue was discussed, above and beyond the realm of the CPE.  That resulted in an additional diagnosis code; therefore, a co-pay of $25.00 was billed to the patient.  Pt came in for labs for anemia on 11/18 and was charged $14.50, which her insurance did not cover and between those two charges that equaled the statement she received in the mail.  They were both extremely upset about the charges and I again, several times over, tried to explain anything discussed outside a regular physical will result in charges.  They both found this to be absurd and price gouging to the patient.  They wanted to know who could fix this for them and I advised this was what the patient owed and there was nothing anyone could fix as far as the accrued charges. ?

## 2021-12-15 NOTE — Telephone Encounter (Signed)
Fyi... in case pt mentions the billing concern at next appt with you ?

## 2021-12-15 NOTE — Telephone Encounter (Signed)
As of now, there are no future appts scheduled.  This was more of an FYI to you in case anything is mentioned in any upcoming appts. ?

## 2022-08-12 ENCOUNTER — Telehealth: Payer: Self-pay | Admitting: Medical

## 2022-08-12 ENCOUNTER — Encounter: Payer: Self-pay | Admitting: Medical

## 2022-08-12 NOTE — Telephone Encounter (Signed)
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 Sent message by my chart to patient  as thought was important. On review pt had expressed thought referral  was unnecessary. I placed referral in past. Gave her information of office I referred to in my chart.
